# Patient Record
Sex: Female | Born: 1965 | Race: White | Hispanic: No | Smoking: Never smoker
Health system: Southern US, Community
[De-identification: ages and names within clinical notes are randomized; demographics above are authoritative.]

## PROBLEM LIST (undated history)

## (undated) DIAGNOSIS — R1011 Right upper quadrant pain: Secondary | ICD-10-CM

## (undated) DIAGNOSIS — K5792 Diverticulitis of intestine, part unspecified, without perforation or abscess without bleeding: Secondary | ICD-10-CM

## (undated) DIAGNOSIS — R011 Cardiac murmur, unspecified: Secondary | ICD-10-CM

## (undated) DIAGNOSIS — K219 Gastro-esophageal reflux disease without esophagitis: Secondary | ICD-10-CM

## (undated) DIAGNOSIS — E119 Type 2 diabetes mellitus without complications: Secondary | ICD-10-CM

## (undated) DIAGNOSIS — D219 Benign neoplasm of connective and other soft tissue, unspecified: Secondary | ICD-10-CM

## (undated) DIAGNOSIS — H269 Unspecified cataract: Secondary | ICD-10-CM

## (undated) DIAGNOSIS — F329 Major depressive disorder, single episode, unspecified: Secondary | ICD-10-CM

## (undated) DIAGNOSIS — N200 Calculus of kidney: Secondary | ICD-10-CM

## (undated) DIAGNOSIS — E039 Hypothyroidism, unspecified: Secondary | ICD-10-CM

## (undated) DIAGNOSIS — I10 Essential (primary) hypertension: Secondary | ICD-10-CM

## (undated) DIAGNOSIS — N83209 Unspecified ovarian cyst, unspecified side: Secondary | ICD-10-CM

## (undated) DIAGNOSIS — T7840XA Allergy, unspecified, initial encounter: Secondary | ICD-10-CM

## (undated) DIAGNOSIS — IMO0001 Reserved for inherently not codable concepts without codable children: Secondary | ICD-10-CM

## (undated) DIAGNOSIS — F32A Depression, unspecified: Secondary | ICD-10-CM

## (undated) HISTORY — PX: EXPLORATORY LAPAROTOMY: SUR591

## (undated) HISTORY — DX: Type 2 diabetes mellitus without complications: E11.9

## (undated) HISTORY — PX: COLONOSCOPY: SHX174

## (undated) HISTORY — DX: Allergy, unspecified, initial encounter: T78.40XA

## (undated) HISTORY — PX: LIPOSUCTION: SHX10

## (undated) HISTORY — DX: Gastro-esophageal reflux disease without esophagitis: K21.9

## (undated) HISTORY — PX: ENDOMETRIAL ABLATION: SHX621

## (undated) HISTORY — PX: TONSILLECTOMY: SUR1361

## (undated) HISTORY — DX: Cardiac murmur, unspecified: R01.1

## (undated) HISTORY — DX: Unspecified cataract: H26.9

## (undated) HISTORY — PX: DILATION AND CURETTAGE OF UTERUS: SHX78

## (undated) HISTORY — PX: TUBAL LIGATION: SHX77

---

## 1998-08-22 ENCOUNTER — Other Ambulatory Visit: Admission: RE | Admit: 1998-08-22 | Discharge: 1998-08-22 | Payer: Self-pay | Admitting: Obstetrics and Gynecology

## 2000-08-05 HISTORY — PX: BREAST EXCISIONAL BIOPSY: SUR124

## 2005-06-17 ENCOUNTER — Ambulatory Visit: Payer: Self-pay | Admitting: Gastroenterology

## 2005-10-18 ENCOUNTER — Ambulatory Visit (HOSPITAL_COMMUNITY): Admission: RE | Admit: 2005-10-18 | Discharge: 2005-10-18 | Payer: Self-pay | Admitting: Obstetrics and Gynecology

## 2005-10-18 ENCOUNTER — Encounter (INDEPENDENT_AMBULATORY_CARE_PROVIDER_SITE_OTHER): Payer: Self-pay | Admitting: *Deleted

## 2006-03-21 ENCOUNTER — Encounter: Admission: RE | Admit: 2006-03-21 | Discharge: 2006-03-21 | Payer: Self-pay | Admitting: Internal Medicine

## 2008-07-07 ENCOUNTER — Encounter: Admission: RE | Admit: 2008-07-07 | Discharge: 2008-07-07 | Payer: Self-pay | Admitting: Internal Medicine

## 2010-05-18 ENCOUNTER — Encounter: Admission: RE | Admit: 2010-05-18 | Discharge: 2010-05-18 | Payer: Self-pay | Admitting: Obstetrics and Gynecology

## 2010-08-25 ENCOUNTER — Encounter: Payer: Self-pay | Admitting: Obstetrics and Gynecology

## 2010-08-26 ENCOUNTER — Encounter: Payer: Self-pay | Admitting: Internal Medicine

## 2010-09-18 ENCOUNTER — Other Ambulatory Visit: Payer: Self-pay | Admitting: Internal Medicine

## 2010-09-18 DIAGNOSIS — R1011 Right upper quadrant pain: Secondary | ICD-10-CM

## 2010-09-20 ENCOUNTER — Ambulatory Visit
Admission: RE | Admit: 2010-09-20 | Discharge: 2010-09-20 | Disposition: A | Discharge status: Home / Self Care | Payer: BC Managed Care – PPO | Source: Ambulatory Visit | Attending: Internal Medicine | Admitting: Internal Medicine

## 2010-09-20 DIAGNOSIS — R1011 Right upper quadrant pain: Secondary | ICD-10-CM

## 2010-09-21 ENCOUNTER — Other Ambulatory Visit (HOSPITAL_COMMUNITY): Payer: Self-pay | Admitting: Internal Medicine

## 2010-09-21 DIAGNOSIS — R1011 Right upper quadrant pain: Secondary | ICD-10-CM

## 2010-10-03 ENCOUNTER — Other Ambulatory Visit (HOSPITAL_COMMUNITY): Payer: BC Managed Care – PPO

## 2010-10-03 ENCOUNTER — Encounter (HOSPITAL_COMMUNITY): Payer: Self-pay

## 2010-10-03 ENCOUNTER — Encounter (HOSPITAL_COMMUNITY)
Admission: RE | Admit: 2010-10-03 | Discharge: 2010-10-03 | Disposition: A | Discharge status: Home / Self Care | Payer: BC Managed Care – PPO | Source: Ambulatory Visit | Attending: Internal Medicine | Admitting: Internal Medicine

## 2010-10-03 DIAGNOSIS — R1011 Right upper quadrant pain: Secondary | ICD-10-CM

## 2010-10-03 DIAGNOSIS — R112 Nausea with vomiting, unspecified: Secondary | ICD-10-CM | POA: Insufficient documentation

## 2010-10-03 HISTORY — DX: Right upper quadrant pain: R10.11

## 2010-10-03 MED ORDER — SINCALIDE 5 MCG IJ SOLR: 0.0200 ug/kg | Freq: Once | INTRAMUSCULAR | Status: DC

## 2010-10-03 MED ORDER — TECHNETIUM TC 99M MEBROFENIN IV KIT
5.3000 | PACK | Freq: Once | INTRAVENOUS | Status: AC | PRN
  Administered 2010-10-03: 5.3 via INTRAVENOUS

## 2010-12-21 NOTE — Op Note (Signed)
Kristen Conner, Kristen Conner         ACCOUNT NO.:  [id number]   MEDICAL RECORD NO.:  [id number]          PATIENT TYPE:  AMB   LOCATION:  SDC                           FACILITY:  [facility]   PHYSICIAN:  Juluis Mire, M.D.   DATE OF BIRTH:  [birth year]   DATE OF PROCEDURE:  10/18/2005  DATE OF DISCHARGE:                                 OPERATIVE REPORT   PREOPERATIVE DIAGNOSES:  1.  Multiparity.  Desires sterility.  2.  Menorrhagia.   OPERATION/PROCEDURE:  1.  Hysteroscopy.  2.  ThermaChoice ablation.  3.  Laparoscopy with bilateral tubal fulguration.   SURGEON:  Juluis Mire, M.D.   ANESTHESIA:  General.   ESTIMATED BLOOD LOSS:  Minimal.   PACKS AND DRAINS:  None.   INTRAOPERATIVE BLOOD REPLACED:  None.   COMPLICATIONS:  None.   INDICATIONS:  Dictated in history and physical.   DESCRIPTION OF PROCEDURE:  The patient was taken to the OR and placed in the  supine position.  After satisfactory level of general anesthesia obtained,  the patient was placed in the dorsal lithotomy position using the Allen  stirrups.  The abdomen, peritoneum, and vagina were prepped out with  Betadine.  The patient was draped out for hysteroscopy.  Speculum was placed  in the vaginal vault.  Cervix was grasped with the single-tooth tenaculum.  Uterus sounded approximately 8 cm.  Cervix was dilated to a size 16 Pratt  dilator.  The hysteroscope was introduced.  Intrauterine cavity was  distended using lactated Ringer's.  Visualization revealed some shaggy  endometrium. We could see the left tubal ostium.  The right tubal ostium was  difficult to visualize.  We could place the Essure.  At this point in time  the decision was to proceed with ThermaChoice ablation.  The balloon was  made ready.  This was inserted into the intrauterine cavity.  The balloon  was distended until we got a consistent pressure reading of approximately  180.  The ThermaChoice cycle was undertaken for eight minutes.  After  the  cool-down period, the ThermaChoice balloon was deflated and removed intact.  We rehysteroscoped to see if we could see the tubal ostium.  Again, we could  see the left side but not the right.  The decision was to proceed with  laparoscopic tubal which we had discussed with the patient.  At this point  in time the patient was reprepped and draped.  The bladder had been emptied  by out catheterization and a Hulka tenaculum put in place.  Subumbilical  incision made with the knife and carried through the subcutaneous tissue and  fascia.  Fascia extended laterally.  Rectus muscles were separated.  We were  able to enter the peritoneum with blunt pressure.  Taut open laparoscopic  trocar was inserted and laparoscope was then brought into place.  Visualization revealed no evidence of injury to adjacent organs.  Uterus,  tubes and ovaries were unremarkable.  Mid segment of each tube was  identified.  Using the bipolar, we coagulate approximately a 3 cm segment of  each tube.  Coagulation was continued until resistance read 0.  Each tube  was then recoagulated, completely desiccating the tube.  Coagulation did  extend out to the mesosalpinx.  At this point in time both tubes were  adequately coagulated.  Uterus, tubes and ovaries were unremarkable.  Appendix was visualized and noted to be normal.  The abdomen was deflated  with carbon dioxide.  Trocar and Taut device were removed.  The fascia was  closed with two figure-of-eights of 0 Vicryl, skin with interrupted  subcuticular of 4-0 Vicryl.  The Hulka tenaculum was then removed.  The  patient was taken out of the dorsal lithotomy position and once alert and  extubated, was transferred to the recovery room in good condition.      Juluis Mire, M.D.  Electronically Signed     JSM/MEDQ  D:  10/18/2005  T:  10/19/2005  Job:  [id number]

## 2010-12-21 NOTE — H&P (Signed)
NAME:  Kristen Conner, Kristen Conner         ACCOUNT NO.:  [id number]   MEDICAL RECORD NO.:  [id number]          PATIENT TYPE:  AMB   LOCATION:  SDC                           FACILITY:  [facility]   PHYSICIAN:  Juluis Mire, M.D.   DATE OF BIRTH:  02-01-1966   DATE OF ADMISSION:  10/18/2005  DATE OF DISCHARGE:                                HISTORY & PHYSICAL   The patient is a 45 year old gravida 4, para 2, abortus 2, female, who  presents for Essuretubal ligation, as well as ThermaChoice endometrial  ablation.   In relation to the present admission, the patient has been having trouble  with increasing menorrhagia.  The cycles are every 21-24 days.  She has  extremely heavy flow with clots.  She underwent a saline infusion ultrasound  and she had 1 small fundal fibroid, but otherwiseunremarkable and felt that  she had adenomyosis.  Options were discussed including the use of birth  control pills versus Mirena IUD versus ablation versus hysterectomy.  The  patient has chosen ablation.  She does need to do something for permanent  sterilization.  We will go with the Essure placement for tubal.  She does  understand the irreversibility of sterilization.  The failure rate of 1/200  is quoted and the failures can be in the form of ectopic pregnancy requiring  further surgical management.   ALLERGIES:  THE PATIENT HAS NO KNOWN DRUG ALLERGIES.   MEDICATIONS:  None.   PAST MEDICAL HISTORY:  1.  Usual childhood diseases.  2.  No significant sequelae.   PAST SURGICAL HISTORY:  In 1994, she had laparoscopy with removal of a  pedunculated fibroid and treatment of endometriosis.  Then in 1996, she had  repeat laparoscopy with lysis of adhesions.   OBSTETRICAL HISTORY:  She has had 2 vaginal deliveries and 2 miscarriages.   SOCIAL HISTORY:  There has been no tobacco or alcohol use.   REVIEW OF SYSTEMS:  Noncontributory.   FAMILY HISTORY:  Noncontributory.   PHYSICAL EXAMINATION:  VITAL SIGNS:  The  patient is afebrile with stable  vital signs.  HEENT EXAM:  The patient is normocephalic.  Pupils are equal, round and  reactive to light and accommodation.  Extraocular movements were intact.  Sclerae and conjunctivae clear.  Oropharynx clear.  NECK:  Without thyromegaly.  BREASTS:  Not examined.  LUNGS:  Clear.  CARDIAC SYSTEM:  Regular rhythm and rate without murmurs or gallops.  ABDOMINAL EXAM:  Benign.  No masses, organomegaly or tenderness.  PELVIC:  Normal external genitalia.  Vaginal mucosa clear.  Cervix  unremarkable.  Uterus normal size, shape and contour.  Adnexa free of masses  or tenderness.  EXTREMITIES:  Trace edema.  NEUROLOGIC EXAM:  Grossly within normal limits.   IMPRESSION:  1.  Menorrhagia secondary to adenomyosis.  2.  Multiparity and desires sterility.   PLAN:  The patient is first to undergo Essure tubal.  We have discussed the  risk of this, which could include the risk of perforation.  The risk of  misplacement of the spring that could require laparoscopic removal.  She  does understand the need  for using something for 3 months and then  hysterosalpingogram to document tubal blockage.  If this comes back  negative, then further options need to be considered for sterilization.  In  terms of the ablation technique, success rates of approximately 40-70% are  quoted.  The risk of the procedure given and explained including the risk of  infection.  The risk of hemorrhage could require a transfusion or possible  hysterectomy, risk of injury to adjacent organs particularly bowel that  could require exploratory surgery, risk of deep venous thrombosis and  pulmonary emboli.  The patient expressed understanding of the indications  and risks.      Juluis Mire, M.D.  Electronically Signed     JSM/MEDQ  D:  10/18/2005  T:  10/19/2005  Job:  [id number]

## 2011-12-12 ENCOUNTER — Other Ambulatory Visit: Payer: Self-pay | Admitting: Obstetrics and Gynecology

## 2011-12-12 DIAGNOSIS — R234 Changes in skin texture: Secondary | ICD-10-CM

## 2011-12-17 ENCOUNTER — Ambulatory Visit
Admission: RE | Admit: 2011-12-17 | Discharge: 2011-12-17 | Disposition: A | Discharge status: Home / Self Care | Payer: BC Managed Care – PPO | Source: Ambulatory Visit | Attending: Obstetrics and Gynecology | Admitting: Obstetrics and Gynecology

## 2011-12-17 ENCOUNTER — Other Ambulatory Visit: Payer: BC Managed Care – PPO

## 2011-12-17 DIAGNOSIS — R234 Changes in skin texture: Secondary | ICD-10-CM

## 2011-12-30 ENCOUNTER — Emergency Department (HOSPITAL_BASED_OUTPATIENT_CLINIC_OR_DEPARTMENT_OTHER): Payer: BC Managed Care – PPO

## 2011-12-30 ENCOUNTER — Emergency Department (HOSPITAL_BASED_OUTPATIENT_CLINIC_OR_DEPARTMENT_OTHER)
Admission: EM | Admit: 2011-12-30 | Discharge: 2011-12-30 | Disposition: A | Discharge status: Home / Self Care | Payer: BC Managed Care – PPO | Attending: Emergency Medicine | Admitting: Emergency Medicine

## 2011-12-30 ENCOUNTER — Encounter (HOSPITAL_BASED_OUTPATIENT_CLINIC_OR_DEPARTMENT_OTHER): Payer: Self-pay | Admitting: *Deleted

## 2011-12-30 DIAGNOSIS — F329 Major depressive disorder, single episode, unspecified: Secondary | ICD-10-CM | POA: Insufficient documentation

## 2011-12-30 DIAGNOSIS — K219 Gastro-esophageal reflux disease without esophagitis: Secondary | ICD-10-CM | POA: Insufficient documentation

## 2011-12-30 DIAGNOSIS — E039 Hypothyroidism, unspecified: Secondary | ICD-10-CM | POA: Insufficient documentation

## 2011-12-30 DIAGNOSIS — R197 Diarrhea, unspecified: Secondary | ICD-10-CM | POA: Insufficient documentation

## 2011-12-30 DIAGNOSIS — R1031 Right lower quadrant pain: Secondary | ICD-10-CM | POA: Insufficient documentation

## 2011-12-30 DIAGNOSIS — R110 Nausea: Secondary | ICD-10-CM | POA: Insufficient documentation

## 2011-12-30 DIAGNOSIS — Z87442 Personal history of urinary calculi: Secondary | ICD-10-CM | POA: Insufficient documentation

## 2011-12-30 DIAGNOSIS — R6883 Chills (without fever): Secondary | ICD-10-CM | POA: Insufficient documentation

## 2011-12-30 DIAGNOSIS — F3289 Other specified depressive episodes: Secondary | ICD-10-CM | POA: Insufficient documentation

## 2011-12-30 DIAGNOSIS — Z79899 Other long term (current) drug therapy: Secondary | ICD-10-CM | POA: Insufficient documentation

## 2011-12-30 DIAGNOSIS — R109 Unspecified abdominal pain: Secondary | ICD-10-CM

## 2011-12-30 DIAGNOSIS — I10 Essential (primary) hypertension: Secondary | ICD-10-CM | POA: Insufficient documentation

## 2011-12-30 HISTORY — DX: Diverticulitis of intestine, part unspecified, without perforation or abscess without bleeding: K57.92

## 2011-12-30 HISTORY — DX: Benign neoplasm of connective and other soft tissue, unspecified: D21.9

## 2011-12-30 HISTORY — DX: Calculus of kidney: N20.0

## 2011-12-30 HISTORY — DX: Reserved for inherently not codable concepts without codable children: IMO0001

## 2011-12-30 HISTORY — DX: Gastro-esophageal reflux disease without esophagitis: K21.9

## 2011-12-30 HISTORY — DX: Unspecified ovarian cyst, unspecified side: N83.209

## 2011-12-30 HISTORY — DX: Major depressive disorder, single episode, unspecified: F32.9

## 2011-12-30 HISTORY — DX: Depression, unspecified: F32.A

## 2011-12-30 HISTORY — DX: Essential (primary) hypertension: I10

## 2011-12-30 HISTORY — DX: Hypothyroidism, unspecified: E03.9

## 2011-12-30 LAB — CBC
HCT: 42.7 % (ref 36.0–46.0)
Hemoglobin: 15.1 g/dL — ABNORMAL HIGH (ref 12.0–15.0)
MCH: 30.8 pg (ref 26.0–34.0)
MCHC: 35.4 g/dL (ref 30.0–36.0)
MCV: 87.1 fL (ref 78.0–100.0)
Platelets: 279 10*3/uL (ref 150–400)
RBC: 4.9 MIL/uL (ref 3.87–5.11)
RDW: 12.5 % (ref 11.5–15.5)
WBC: 8.5 10*3/uL (ref 4.0–10.5)

## 2011-12-30 LAB — COMPREHENSIVE METABOLIC PANEL WITH GFR
Albumin: 4 g/dL (ref 3.5–5.2)
BUN: 9 mg/dL (ref 6–23)
Chloride: 105 meq/L (ref 96–112)
Creatinine, Ser: 0.6 mg/dL (ref 0.50–1.10)
GFR calc Af Amer: >90 mL/min (ref >90)
GFR calc non Af Amer: >90 mL/min (ref >90)
Glucose, Bld: 122 mg/dL — ABNORMAL HIGH (ref 70–99)
Total Bilirubin: 0.5 mg/dL (ref 0.3–1.2)

## 2011-12-30 LAB — URINALYSIS, ROUTINE W REFLEX MICROSCOPIC
Bilirubin Urine: NEGATIVE
Glucose, UA: NEGATIVE mg/dL
Hgb urine dipstick: NEGATIVE
Ketones, ur: NEGATIVE mg/dL
Leukocytes, UA: NEGATIVE
Nitrite: NEGATIVE
Protein, ur: NEGATIVE mg/dL
Specific Gravity, Urine: 1.022 (ref 1.005–1.030)
Urobilinogen, UA: 0.2 mg/dL (ref 0.0–1.0)
pH: 5.5 (ref 5.0–8.0)

## 2011-12-30 LAB — COMPREHENSIVE METABOLIC PANEL
ALT: 31 U/L (ref 0–35)
AST: 21 U/L (ref 0–37)
Alkaline Phosphatase: 83 U/L (ref 39–117)
CO2: 27 mEq/L (ref 19–32)
Calcium: 9.5 mg/dL (ref 8.4–10.5)
Potassium: 3.4 mEq/L — ABNORMAL LOW (ref 3.5–5.1)
Sodium: 141 mEq/L (ref 135–145)
Total Protein: 7.2 g/dL (ref 6.0–8.3)

## 2011-12-30 LAB — DIFFERENTIAL
Basophils Absolute: 0.1 10*3/uL (ref 0.0–0.1)
Basophils Relative: 1 % (ref 0–1)
Eosinophils Absolute: 0.1 10*3/uL (ref 0.0–0.7)
Eosinophils Relative: 1 % (ref 0–5)
Lymphocytes Relative: 24 % (ref 12–46)
Lymphs Abs: 2.1 10*3/uL (ref 0.7–4.0)
Monocytes Absolute: 0.4 K/uL (ref 0.1–1.0)
Monocytes Relative: 4 % (ref 3–12)
Neutro Abs: 5.9 K/uL (ref 1.7–7.7)
Neutrophils Relative %: 70 % (ref 43–77)

## 2011-12-30 LAB — PREGNANCY, URINE: Preg Test, Ur: NEGATIVE

## 2011-12-30 LAB — LIPASE, BLOOD: Lipase: 26 U/L (ref 11–59)

## 2011-12-30 MED ORDER — HYDROCODONE-ACETAMINOPHEN 5-325 MG PO TABS: ORAL_TABLET | ORAL | Status: AC

## 2011-12-30 MED ORDER — IOHEXOL 300 MG/ML  SOLN
100.0000 mL | Freq: Once | INTRAMUSCULAR | Status: AC | PRN
  Administered 2011-12-30: 100 mL via INTRAVENOUS

## 2011-12-30 MED ORDER — AMOXICILLIN-POT CLAVULANATE 875-125 MG PO TABS: 1.0000 | ORAL_TABLET | Freq: Two times a day (BID) | ORAL | Status: AC

## 2011-12-30 MED ORDER — KETOROLAC TROMETHAMINE 30 MG/ML IJ SOLN
30.0000 mg | Freq: Once | INTRAMUSCULAR | Status: AC
  Administered 2011-12-30: 30 mg via INTRAVENOUS
  Filled 2011-12-30: qty 1

## 2011-12-30 MED ORDER — IOHEXOL 300 MG/ML  SOLN
36.0000 mL | Freq: Once | INTRAMUSCULAR | Status: AC | PRN
  Administered 2011-12-30: 36 mL via ORAL

## 2011-12-30 MED ORDER — SODIUM CHLORIDE 0.9 % IV SOLN
Freq: Once | INTRAVENOUS | Status: AC
  Administered 2011-12-30: 12:00:00 via INTRAVENOUS

## 2011-12-30 NOTE — ED Provider Notes (Signed)
History     CSN: [id number]  Arrival date & time 12/30/11  1008   First MD Initiated Contact with Patient 12/30/11 1108      Chief Complaint  Patient presents with  . Abdominal Pain    RLQ    (Consider location/radiation/quality/duration/timing/severity/associated sxs/prior treatment) HPI Comments: Pt reports h/o kidney stone, diverticulitis on right side and ovarian cyst, reports gradual mild pain to RLQ on Friday, has gotten worse and has waxing and waning quality.  Currently pain is a 1/10, yesterday had gotten up to a 8/10 pain while taking a shower, had to lay down due to severity, hard to move.  No dysuria, hematuria.  Slight nausea and slight chill, but no known fever, no vomiting.  Had a loose stool for a few days previously as well but she has some IBS and didn't think the loose stool a few days ago was unusual.  No vaginal bleeding.  No back pain, denies trauma.  No skin rash.    Patient is a 46 y.o. female presenting with abdominal pain. The history is provided by the patient.  Abdominal Pain The primary symptoms of the illness include abdominal pain, nausea and diarrhea. The primary symptoms of the illness do not include fever, vomiting, dysuria, vaginal discharge or vaginal bleeding.  Additional symptoms associated with the illness include chills. Symptoms associated with the illness do not include constipation, hematuria or back pain.    Past Medical History  Diagnosis Date  . RUQ abdominal pain   . Diverticulitis   . Hypothyroid   . Kidney stone   . Hypertension   . Reflux   . Depression   . Fibroid tumor   . Ovarian cyst     Past Surgical History  Procedure Date  . Tonsillectomy   . Exploratory laparotomy   . Dilation and curettage of uterus   . Liposuction   . Breast biopsy   . Endometrial ablation   . Tubal ligation     History reviewed. No pertinent family history.  History  Substance Use Topics  . Smoking status: Never Smoker   . Smokeless  tobacco: Not on file  . Alcohol Use: Yes     rarely    OB History    Grav Para Term Preterm Abortions TAB SAB Ect Mult Living                  Review of Systems  Constitutional: Positive for chills and appetite change. Negative for fever.  Gastrointestinal: Positive for nausea, abdominal pain and diarrhea. Negative for vomiting, constipation and blood in stool.  Genitourinary: Negative for dysuria, hematuria, flank pain, vaginal bleeding and vaginal discharge.  Musculoskeletal: Negative for back pain.  Skin: Negative for rash and wound.  All other systems reviewed and are negative.    Allergies  Demerol  Home Medications   Current Outpatient Rx  Name Route Sig Dispense Refill  . AMLODIPINE BESYLATE 5 MG PO TABS Oral Take 5 mg by mouth daily.    Marland Kitchen CITALOPRAM HYDROBROMIDE 40 MG PO TABS Oral Take 40 mg by mouth daily.    . ERGOCALCIFEROL 50000 UNITS PO CAPS Oral Take 50,000 Units by mouth once a week.    Marland Kitchen LEVOTHYROXINE SODIUM 100 MCG PO TABS Oral Take 100 mcg by mouth daily.    . AMOXICILLIN-POT CLAVULANATE 875-125 MG PO TABS Oral Take 1 tablet by mouth 2 (two) times daily. 20 tablet 0  . HYDROCODONE-ACETAMINOPHEN 5-325 MG PO TABS  1-2 tablets po q  6 hours prn moderate to severe pain 20 tablet 0    BP 136/88  Pulse 87  Temp(Src) 98.3 F (36.8 C) (Oral)  Resp 20  Ht 5\' 7"  (1.702 m)  Wt 205 lb (92.987 kg)  BMI 32.11 kg/m2  SpO2 99%  Physical Exam  Nursing note and vitals reviewed. Constitutional: She appears well-developed and well-nourished.  HENT:  Head: Normocephalic and atraumatic.  Mouth/Throat: Oropharynx is clear and moist.  Eyes: No scleral icterus.  Neck: Normal range of motion. Neck supple.  Cardiovascular: Normal rate.   Pulmonary/Chest: Effort normal and breath sounds normal. No respiratory distress.  Abdominal: Soft. She exhibits no distension. There is no tenderness. There is no rebound and no guarding.  Neurological: She is alert.  Skin: Skin is  warm. No rash noted. No pallor.    ED Course  Procedures (including critical care time)  Labs Reviewed  URINALYSIS, ROUTINE W REFLEX MICROSCOPIC - Abnormal; Notable for the following:    APPearance CLOUDY (*)    All other components within normal limits  CBC - Abnormal; Notable for the following:    Hemoglobin 15.1 (*)    All other components within normal limits  COMPREHENSIVE METABOLIC PANEL - Abnormal; Notable for the following:    Potassium 3.4 (*)    Glucose, Bld 122 (*)    All other components within normal limits  PREGNANCY, URINE  DIFFERENTIAL  LIPASE, BLOOD   Ct Abdomen Pelvis W Contrast  12/30/2011  *RADIOLOGY REPORT*  Clinical Data: Right-sided abdominal pain, nausea  CT ABDOMEN AND PELVIS WITH CONTRAST  Technique:  Multidetector CT imaging of the abdomen and pelvis was performed following the standard protocol during bolus administration of intravenous contrast.  Contrast: 36mL OMNIPAQUE IOHEXOL 300 MG/ML  SOLN, OMNIPAQUE IOHEXOL 300 MG/ML  SOLN  Comparison: None.  Findings: Lung bases are clear.  Small hiatal hernia.  Mild hepatic steatosis.  Spleen, pancreas, and adrenal glands are within normal limits.  Gallbladder is unremarkable.  No intrahepatic or extrahepatic ductal dilatation.  Kidneys are within normal limits.  No hydronephrosis.  No evidence of bowel obstruction.  Normal appendix.  Extensive colonic diverticulosis.  Minimal stranding in the left paracolic gutter (series 2/image 47), nonspecific, without sufficient inflammatory changes to suggest acute diverticulitis.  No evidence of abdominal aortic aneurysm.  No abdominopelvic ascites.  No suspicious abdominopelvic lymphadenopathy.  Uterus is mildly heterogeneous.  Bilateral ovaries are unremarkable, noting a left corpus luteal cyst.  Bladder is within normal limits.  Degenerative changes with scoliosis of the visualized thoracolumbar spine.  IMPRESSION: Normal appendix.  No evidence of bowel obstruction.   Extensive colonic diverticulosis, without convincing inflammatory changes.  Mild hepatic steatosis.  Original Report Authenticated By: Charline Bills, M.D.   I reviewed the above CT scan and reviewed results per radiologist.    1. Abdominal pain     1:53 PM Pt's pain continues to be minimal, abd is soft, no guard or rebound.  Pt feels comfortable going home, follow up with own PCP and defer pelvic exam for now with understanding that should pelvic pain or vomiting or fever ensure, to be rechecked.  Will put on augmentin due to extensive diverticulosis seen on CT scan and clinically, diverticulitis is what I was most suspicious for.    MDM  Pt's pain was not sudden onset, I would favor early diverticulitis as cause of gradual onset and now some waxing and waning pain, versus non specific abd pain.  No UTI, no hematuria on UA.  No vomiting or colicky pain to suggest torsion, stone, or ruptured cyst.          Gavin Pound. Bronislaw Switzer, MD 12/30/11 1353

## 2011-12-30 NOTE — ED Notes (Signed)
Patient states she developed right lower back, flank and rlq abominal pain 4 days ago.  Pain worse over the weekend.  Hx of kidney stones, ovarian cyst and diverticulitis on right. Symptoms associated with nausea.

## 2015-06-15 ENCOUNTER — Other Ambulatory Visit: Payer: Self-pay | Admitting: Internal Medicine

## 2015-06-15 DIAGNOSIS — R1011 Right upper quadrant pain: Secondary | ICD-10-CM

## 2015-06-20 ENCOUNTER — Other Ambulatory Visit

## 2015-06-27 ENCOUNTER — Other Ambulatory Visit

## 2015-08-01 ENCOUNTER — Other Ambulatory Visit: Payer: Self-pay | Admitting: Internal Medicine

## 2015-08-01 ENCOUNTER — Ambulatory Visit
Admission: RE | Admit: 2015-08-01 | Discharge: 2015-08-01 | Disposition: A | Discharge status: Home / Self Care | Payer: BLUE CROSS/BLUE SHIELD | Source: Ambulatory Visit | Attending: Internal Medicine | Admitting: Internal Medicine

## 2015-08-01 DIAGNOSIS — R1011 Right upper quadrant pain: Secondary | ICD-10-CM

## 2016-08-06 ENCOUNTER — Other Ambulatory Visit: Payer: Self-pay | Admitting: Obstetrics and Gynecology

## 2016-08-06 DIAGNOSIS — R928 Other abnormal and inconclusive findings on diagnostic imaging of breast: Secondary | ICD-10-CM

## 2016-08-12 ENCOUNTER — Ambulatory Visit
Admission: RE | Admit: 2016-08-12 | Discharge: 2016-08-12 | Disposition: A | Discharge status: Home / Self Care | Source: Ambulatory Visit | Attending: Obstetrics and Gynecology | Admitting: Obstetrics and Gynecology

## 2016-08-12 ENCOUNTER — Other Ambulatory Visit: Payer: BLUE CROSS/BLUE SHIELD

## 2016-08-12 DIAGNOSIS — R928 Other abnormal and inconclusive findings on diagnostic imaging of breast: Secondary | ICD-10-CM

## 2017-03-11 ENCOUNTER — Encounter: Payer: Self-pay | Admitting: Internal Medicine

## 2017-04-29 ENCOUNTER — Other Ambulatory Visit: Payer: Self-pay | Admitting: Obstetrics and Gynecology

## 2017-04-29 ENCOUNTER — Other Ambulatory Visit: Payer: Self-pay | Admitting: Neurosurgery

## 2017-04-29 DIAGNOSIS — Z1231 Encounter for screening mammogram for malignant neoplasm of breast: Secondary | ICD-10-CM

## 2017-05-16 ENCOUNTER — Encounter

## 2017-05-20 ENCOUNTER — Ambulatory Visit (AMBULATORY_SURGERY_CENTER): Payer: Self-pay | Admitting: *Deleted

## 2017-05-20 VITALS — Ht 65.0 in | Wt 203.8 lb

## 2017-05-20 DIAGNOSIS — Z1211 Encounter for screening for malignant neoplasm of colon: Secondary | ICD-10-CM

## 2017-05-20 MED ORDER — NA SULFATE-K SULFATE-MG SULF 17.5-3.13-1.6 GM/177ML PO SOLN: 1.0000 [IU] | Freq: Once | ORAL | 0 refills | Status: AC

## 2017-05-20 NOTE — Progress Notes (Signed)
No egg or soy allergy known to patient  No issues with past sedation with any surgeries  or procedures, no intubation problems   N&V with some No diet pills per patient No home 02 use per patient  No blood thinners per patient  Pt denies issues with constipation  No A fib or A flutter  EMMI video sent to pt's e mail

## 2017-05-30 ENCOUNTER — Encounter: Payer: Self-pay | Admitting: Internal Medicine

## 2017-05-30 ENCOUNTER — Ambulatory Visit (AMBULATORY_SURGERY_CENTER): Admitting: Internal Medicine

## 2017-05-30 VITALS — BP 117/68 | HR 81 | Temp 97.8°F | Resp 12 | Ht 65.0 in | Wt 203.0 lb

## 2017-05-30 DIAGNOSIS — K635 Polyp of colon: Secondary | ICD-10-CM | POA: Diagnosis not present

## 2017-05-30 DIAGNOSIS — Z1212 Encounter for screening for malignant neoplasm of rectum: Secondary | ICD-10-CM | POA: Diagnosis not present

## 2017-05-30 DIAGNOSIS — D125 Benign neoplasm of sigmoid colon: Secondary | ICD-10-CM | POA: Diagnosis not present

## 2017-05-30 DIAGNOSIS — Z1211 Encounter for screening for malignant neoplasm of colon: Secondary | ICD-10-CM | POA: Diagnosis present

## 2017-05-30 MED ORDER — SODIUM CHLORIDE 0.9 % IV SOLN: 500.0000 mL | INTRAVENOUS | Status: DC

## 2017-05-30 NOTE — Op Note (Signed)
[facility] Patient Name: Kristen Conner Procedure Date: 05/30/2017 8:07 AM MRN: [id number] Endoscopist: Jerene Bears , MD Age: 51 Referring MD:  Date of Birth: Jan 23, 1966 Gender: Female Account #: [id number] Procedure:                Colonoscopy Indications:              Screening for colorectal malignant neoplasm Medicines:                Monitored Anesthesia Care Procedure:                Pre-Anesthesia Assessment:                           - Prior to the procedure, a History and Physical                            was performed, and patient medications and                            allergies were reviewed. The patient's tolerance of                            previous anesthesia was also reviewed. The risks                            and benefits of the procedure and the sedation                            options and risks were discussed with the patient.                            All questions were answered, and informed consent                            was obtained. Prior Anticoagulants: The patient has                            taken no previous anticoagulant or antiplatelet                            agents. ASA Grade Assessment: II - A patient with                            mild systemic disease. After reviewing the risks                            and benefits, the patient was deemed in                            satisfactory condition to undergo the procedure.                           After obtaining informed consent, the colonoscope  was passed under direct vision. Throughout the                            procedure, the patient's blood pressure, pulse, and                            oxygen saturations were monitored continuously. The                            Colonoscope was introduced through the anus and                            advanced to the the cecum, identified by                            appendiceal orifice and  ileocecal valve. The                            colonoscopy was performed without difficulty. The                            patient tolerated the procedure well. The quality                            of the bowel preparation was good. The ileocecal                            valve, appendiceal orifice, and rectum were                            photographed. Scope In: 8:08:53 AM Scope Out: 8:30:21 AM Scope Withdrawal Time: 0 hours 17 minutes 9 seconds  Total Procedure Duration: 0 hours 21 minutes 28 seconds  Findings:                 The digital rectal exam was normal.                           A 10 mm polyp was found in the sigmoid colon. The                            polyp was sessile. The polyp was removed with a                            cold snare. Resection and retrieval were complete.                           A 6 mm polyp was found in the distal sigmoid colon.                            The polyp was sessile. The polyp was removed with a                            cold snare.  Resection and retrieval were complete.                           Multiple small and large-mouthed diverticula were                            found in the sigmoid colon, descending colon,                            hepatic flexure and ascending colon.                           Internal hemorrhoids were found during                            retroflexion. The hemorrhoids were small. Complications:            No immediate complications. Estimated Blood Loss:     Estimated blood loss was minimal. Impression:               - One 10 mm polyp in the sigmoid colon, removed                            with a cold snare. Resected and retrieved.                           - One 6 mm polyp in the distal sigmoid colon,                            removed with a cold snare. Resected and retrieved.                           - Moderate diverticulosis in the sigmoid colon, in                            the descending  colon, at the hepatic flexure and in                            the ascending colon.                           - Small internal hemorrhoids. Recommendation:           - Patient has a contact number available for                            emergencies. The signs and symptoms of potential                            delayed complications were discussed with the                            patient. Return to normal activities tomorrow.  Written discharge instructions were provided to the                            patient.                           - Resume previous diet.                           - Continue present medications.                           - Await pathology results.                           - Repeat colonoscopy is recommended. The                            colonoscopy date will be determined after pathology                            results from today's exam become available for                            review. Jerene Bears, MD 05/30/2017 8:37:11 AM This report has been signed electronically.

## 2017-05-30 NOTE — Progress Notes (Signed)
No problems noted in the recovery room. maw 

## 2017-05-30 NOTE — Progress Notes (Signed)
Report to PACU, RN, vss, BBS= Clear.  

## 2017-05-30 NOTE — Progress Notes (Signed)
Called to room to assist during endoscopic procedure.  Patient ID and intended procedure confirmed with present staff. Received instructions for my participation in the procedure from the performing physician.  

## 2017-05-30 NOTE — Progress Notes (Signed)
Pt's states no medical or surgical changes since previsit or office visit. 

## 2017-05-30 NOTE — Patient Instructions (Signed)
YOU HAD AN ENDOSCOPIC PROCEDURE TODAY AT [facility]:   Refer to the procedure report that was given to you for any specific questions about what was found during the examination.  If the procedure report does not answer your questions, please call your gastroenterologist to clarify.  If you requested that your care partner not be given the details of your procedure findings, then the procedure report has been included in a sealed envelope for you to review at your convenience later.  YOU SHOULD EXPECT: Some feelings of bloating in the abdomen. Passage of more gas than usual.  Walking can help get rid of the air that was put into your GI tract during the procedure and reduce the bloating. If you had a lower endoscopy (such as a colonoscopy or flexible sigmoidoscopy) you may notice spotting of blood in your stool or on the toilet paper. If you underwent a bowel prep for your procedure, you may not have a normal bowel movement for a few days.  Please Note:  You might notice some irritation and congestion in your nose or some drainage.  This is from the oxygen used during your procedure.  There is no need for concern and it should clear up in a day or so.  SYMPTOMS TO REPORT IMMEDIATELY:   Following lower endoscopy (colonoscopy or flexible sigmoidoscopy):  Excessive amounts of blood in the stool  Significant tenderness or worsening of abdominal pains  Swelling of the abdomen that is new, acute  Fever of 100F or higher   For urgent or emergent issues, a gastroenterologist can be reached at any hour by calling [phone].   DIET:  We do recommend a small meal at first, but then you may proceed to your regular diet.  Drink plenty of fluids but you should avoid alcoholic beverages for 24 hours.  ACTIVITY:  You should plan to take it easy for the rest of today and you should NOT DRIVE or use heavy machinery until tomorrow (because of the sedation medicines used during the test).     FOLLOW UP: Our staff will call the number listed on your records the next business day following your procedure to check on you and address any questions or concerns that you may have regarding the information given to you following your procedure. If we do not reach you, we will leave a message.  However, if you are feeling well and you are not experiencing any problems, there is no need to return our call.  We will assume that you have returned to your regular daily activities without incident.  If any biopsies were taken you will be contacted by phone or by letter within the next 1-3 weeks.  Please call us at [phone] if you have not heard about the biopsies in 3 weeks.    SIGNATURES/CONFIDENTIALITY: You and/or your care partner have signed paperwork which will be entered into your electronic medical record.  These signatures attest to the fact that that the information above on your After Visit Summary has been reviewed and is understood.  Full responsibility of the confidentiality of this discharge information lies with you and/or your care-partner.    Handouts were given to your care partner on polyps, diverticulosis, and hemorrhoids. Your blood sugar was 110 in the recovery room. You may resume your current medications today. Await biopsy results. Please call if any questions or concerns.

## 2017-06-05 ENCOUNTER — Encounter: Payer: Self-pay | Admitting: Internal Medicine

## 2017-08-04 ENCOUNTER — Ambulatory Visit
Admission: RE | Admit: 2017-08-04 | Discharge: 2017-08-04 | Disposition: A | Discharge status: Home / Self Care | Source: Ambulatory Visit | Attending: Obstetrics and Gynecology | Admitting: Obstetrics and Gynecology

## 2017-08-04 DIAGNOSIS — Z1231 Encounter for screening mammogram for malignant neoplasm of breast: Secondary | ICD-10-CM

## 2018-12-08 ENCOUNTER — Other Ambulatory Visit: Payer: Self-pay

## 2018-12-08 ENCOUNTER — Ambulatory Visit
Admission: RE | Admit: 2018-12-08 | Discharge: 2018-12-08 | Disposition: A | Discharge status: Home / Self Care | Source: Ambulatory Visit | Attending: Internal Medicine | Admitting: Internal Medicine

## 2018-12-08 ENCOUNTER — Other Ambulatory Visit: Payer: Self-pay | Admitting: Internal Medicine

## 2018-12-08 DIAGNOSIS — N644 Mastodynia: Secondary | ICD-10-CM

## 2019-09-13 ENCOUNTER — Other Ambulatory Visit: Payer: Self-pay | Admitting: Obstetrics and Gynecology

## 2019-09-13 DIAGNOSIS — R928 Other abnormal and inconclusive findings on diagnostic imaging of breast: Secondary | ICD-10-CM

## 2019-09-23 ENCOUNTER — Other Ambulatory Visit

## 2019-09-23 ENCOUNTER — Encounter

## 2019-09-27 ENCOUNTER — Ambulatory Visit
Admission: RE | Admit: 2019-09-27 | Discharge: 2019-09-27 | Disposition: A | Discharge status: Home / Self Care | Source: Ambulatory Visit | Attending: Obstetrics and Gynecology | Admitting: Obstetrics and Gynecology

## 2019-09-27 ENCOUNTER — Other Ambulatory Visit: Payer: Self-pay

## 2019-09-27 ENCOUNTER — Other Ambulatory Visit: Payer: Self-pay | Admitting: Obstetrics and Gynecology

## 2019-09-27 DIAGNOSIS — R928 Other abnormal and inconclusive findings on diagnostic imaging of breast: Secondary | ICD-10-CM

## 2019-10-01 ENCOUNTER — Other Ambulatory Visit: Payer: Self-pay

## 2019-10-01 ENCOUNTER — Other Ambulatory Visit: Payer: Self-pay | Admitting: Diagnostic Radiology

## 2019-10-01 ENCOUNTER — Ambulatory Visit
Admission: RE | Admit: 2019-10-01 | Discharge: 2019-10-01 | Disposition: A | Discharge status: Home / Self Care | Source: Ambulatory Visit | Attending: Obstetrics and Gynecology | Admitting: Obstetrics and Gynecology

## 2019-10-01 ENCOUNTER — Other Ambulatory Visit: Payer: Self-pay | Admitting: Obstetrics and Gynecology

## 2019-10-01 DIAGNOSIS — R928 Other abnormal and inconclusive findings on diagnostic imaging of breast: Secondary | ICD-10-CM

## 2019-10-01 HISTORY — PX: BREAST BIOPSY: SHX20

## 2020-09-11 ENCOUNTER — Other Ambulatory Visit: Payer: Self-pay | Admitting: Obstetrics and Gynecology

## 2020-09-11 DIAGNOSIS — Z1231 Encounter for screening mammogram for malignant neoplasm of breast: Secondary | ICD-10-CM

## 2020-11-01 ENCOUNTER — Other Ambulatory Visit: Payer: Self-pay

## 2020-11-01 ENCOUNTER — Ambulatory Visit
Admission: RE | Admit: 2020-11-01 | Discharge: 2020-11-01 | Disposition: A | Discharge status: Home / Self Care | Source: Ambulatory Visit | Attending: Obstetrics and Gynecology | Admitting: Obstetrics and Gynecology

## 2020-11-01 DIAGNOSIS — Z1231 Encounter for screening mammogram for malignant neoplasm of breast: Secondary | ICD-10-CM

## 2020-12-31 ENCOUNTER — Emergency Department (HOSPITAL_BASED_OUTPATIENT_CLINIC_OR_DEPARTMENT_OTHER)

## 2020-12-31 ENCOUNTER — Inpatient Hospital Stay (HOSPITAL_BASED_OUTPATIENT_CLINIC_OR_DEPARTMENT_OTHER)
Admission: EM | Admit: 2020-12-31 | Discharge: 2021-01-04 | DRG: 378 | Disposition: A | Discharge status: Home / Self Care | Attending: Internal Medicine | Admitting: Internal Medicine

## 2020-12-31 ENCOUNTER — Encounter (HOSPITAL_BASED_OUTPATIENT_CLINIC_OR_DEPARTMENT_OTHER): Payer: Self-pay | Admitting: Emergency Medicine

## 2020-12-31 ENCOUNTER — Other Ambulatory Visit: Payer: Self-pay

## 2020-12-31 DIAGNOSIS — K8020 Calculus of gallbladder without cholecystitis without obstruction: Secondary | ICD-10-CM | POA: Diagnosis present

## 2020-12-31 DIAGNOSIS — E785 Hyperlipidemia, unspecified: Secondary | ICD-10-CM | POA: Diagnosis present

## 2020-12-31 DIAGNOSIS — K5792 Diverticulitis of intestine, part unspecified, without perforation or abscess without bleeding: Secondary | ICD-10-CM | POA: Diagnosis not present

## 2020-12-31 DIAGNOSIS — Z79890 Hormone replacement therapy (postmenopausal): Secondary | ICD-10-CM | POA: Diagnosis not present

## 2020-12-31 DIAGNOSIS — D249 Benign neoplasm of unspecified breast: Secondary | ICD-10-CM | POA: Diagnosis present

## 2020-12-31 DIAGNOSIS — K219 Gastro-esophageal reflux disease without esophagitis: Secondary | ICD-10-CM

## 2020-12-31 DIAGNOSIS — E039 Hypothyroidism, unspecified: Secondary | ICD-10-CM

## 2020-12-31 DIAGNOSIS — K5733 Diverticulitis of large intestine without perforation or abscess with bleeding: Principal | ICD-10-CM | POA: Diagnosis present

## 2020-12-31 DIAGNOSIS — K922 Gastrointestinal hemorrhage, unspecified: Secondary | ICD-10-CM | POA: Diagnosis present

## 2020-12-31 DIAGNOSIS — Z885 Allergy status to narcotic agent status: Secondary | ICD-10-CM

## 2020-12-31 DIAGNOSIS — E119 Type 2 diabetes mellitus without complications: Secondary | ICD-10-CM | POA: Diagnosis not present

## 2020-12-31 DIAGNOSIS — I700 Atherosclerosis of aorta: Secondary | ICD-10-CM | POA: Diagnosis present

## 2020-12-31 DIAGNOSIS — K625 Hemorrhage of anus and rectum: Secondary | ICD-10-CM | POA: Diagnosis present

## 2020-12-31 DIAGNOSIS — D62 Acute posthemorrhagic anemia: Secondary | ICD-10-CM

## 2020-12-31 DIAGNOSIS — Z79899 Other long term (current) drug therapy: Secondary | ICD-10-CM | POA: Diagnosis not present

## 2020-12-31 DIAGNOSIS — Z8371 Family history of colonic polyps: Secondary | ICD-10-CM

## 2020-12-31 DIAGNOSIS — Z801 Family history of malignant neoplasm of trachea, bronchus and lung: Secondary | ICD-10-CM

## 2020-12-31 DIAGNOSIS — Z881 Allergy status to other antibiotic agents status: Secondary | ICD-10-CM

## 2020-12-31 DIAGNOSIS — F32A Depression, unspecified: Secondary | ICD-10-CM | POA: Diagnosis present

## 2020-12-31 DIAGNOSIS — E11638 Type 2 diabetes mellitus with other oral complications: Secondary | ICD-10-CM

## 2020-12-31 DIAGNOSIS — K648 Other hemorrhoids: Secondary | ICD-10-CM | POA: Diagnosis present

## 2020-12-31 DIAGNOSIS — I10 Essential (primary) hypertension: Secondary | ICD-10-CM | POA: Diagnosis not present

## 2020-12-31 DIAGNOSIS — K5790 Diverticulosis of intestine, part unspecified, without perforation or abscess without bleeding: Secondary | ICD-10-CM | POA: Diagnosis not present

## 2020-12-31 DIAGNOSIS — Z20822 Contact with and (suspected) exposure to covid-19: Secondary | ICD-10-CM | POA: Diagnosis not present

## 2020-12-31 LAB — CBC WITH DIFFERENTIAL/PLATELET
Abs Immature Granulocytes: 0.02 10*3/uL (ref 0.00–0.07)
Basophils Absolute: 0.1 10*3/uL (ref 0.0–0.1)
Basophils Relative: 1 %
Eosinophils Absolute: 0.1 10*3/uL (ref 0.0–0.5)
Eosinophils Relative: 1 %
HCT: 41.1 % (ref 36.0–46.0)
Hemoglobin: 14.4 g/dL (ref 12.0–15.0)
Immature Granulocytes: 0 %
Lymphocytes Relative: 34 %
Lymphs Abs: 2.7 10*3/uL (ref 0.7–4.0)
MCH: 31 pg (ref 26.0–34.0)
MCHC: 35 g/dL (ref 30.0–36.0)
MCV: 88.4 fL (ref 80.0–100.0)
Monocytes Absolute: 0.4 10*3/uL (ref 0.1–1.0)
Monocytes Relative: 5 %
Neutro Abs: 4.7 10*3/uL (ref 1.7–7.7)
Neutrophils Relative %: 59 %
Platelets: 322 10*3/uL (ref 150–400)
RBC: 4.65 MIL/uL (ref 3.87–5.11)
RDW: 11.9 % (ref 11.5–15.5)
WBC: 7.9 10*3/uL (ref 4.0–10.5)
nRBC: 0 % (ref 0.0–0.2)

## 2020-12-31 LAB — RESP PANEL BY RT-PCR (FLU A&B, COVID) ARPGX2
Influenza A by PCR: NEGATIVE
Influenza B by PCR: NEGATIVE
SARS Coronavirus 2 by RT PCR: NEGATIVE

## 2020-12-31 LAB — GLUCOSE, CAPILLARY: Glucose-Capillary: 209 mg/dL — ABNORMAL HIGH (ref 70–99)

## 2020-12-31 LAB — COMPREHENSIVE METABOLIC PANEL
ALT: 33 U/L (ref 0–44)
AST: 23 U/L (ref 15–41)
Albumin: 3.9 g/dL (ref 3.5–5.0)
Alkaline Phosphatase: 75 U/L (ref 38–126)
Anion gap: 9 (ref 5–15)
BUN: 15 mg/dL (ref 6–20)
CO2: 22 mmol/L (ref 22–32)
Calcium: 9.2 mg/dL (ref 8.9–10.3)
Chloride: 107 mmol/L (ref 98–111)
Creatinine, Ser: 0.51 mg/dL (ref 0.44–1.00)
GFR, Estimated: >60 mL/min (ref >60)
Glucose, Bld: 154 mg/dL — ABNORMAL HIGH (ref 70–99)
Potassium: 3.8 mmol/L (ref 3.5–5.1)
Sodium: 138 mmol/L (ref 135–145)
Total Bilirubin: 0.4 mg/dL (ref 0.3–1.2)
Total Protein: 7.2 g/dL (ref 6.5–8.1)

## 2020-12-31 LAB — OCCULT BLOOD X 1 CARD TO LAB, STOOL: Fecal Occult Bld: POSITIVE — AB

## 2020-12-31 LAB — LIPASE, BLOOD: Lipase: 32 U/L (ref 11–51)

## 2020-12-31 LAB — HEMOGLOBIN: Hemoglobin: 13.6 g/dL (ref 12.0–15.0)

## 2020-12-31 MED ORDER — SODIUM CHLORIDE 0.9 % IV SOLN: INTRAVENOUS | Status: DC

## 2020-12-31 MED ORDER — METOPROLOL SUCCINATE ER 25 MG PO TB24
25.0000 mg | ORAL_TABLET | Freq: Every day | ORAL | Status: DC
  Administered 2021-01-01 – 2021-01-04 (×4): 25 mg via ORAL
  Filled 2020-12-31 (×4): qty 1

## 2020-12-31 MED ORDER — ALPRAZOLAM 0.5 MG PO TABS
0.5000 mg | ORAL_TABLET | Freq: Every day | ORAL | Status: DC | PRN
  Administered 2021-01-01 – 2021-01-03 (×4): 0.5 mg via ORAL
  Filled 2020-12-31 (×4): qty 1

## 2020-12-31 MED ORDER — SODIUM CHLORIDE 0.9 % IV BOLUS
1000.0000 mL | Freq: Once | INTRAVENOUS | Status: AC
  Administered 2020-12-31: 1000 mL via INTRAVENOUS

## 2020-12-31 MED ORDER — ACETAMINOPHEN 325 MG PO TABS
650.0000 mg | ORAL_TABLET | Freq: Four times a day (QID) | ORAL | Status: DC | PRN
  Administered 2021-01-02: 650 mg via ORAL
  Filled 2020-12-31: qty 2

## 2020-12-31 MED ORDER — ONDANSETRON HCL 4 MG/2ML IJ SOLN: 4.0000 mg | Freq: Four times a day (QID) | INTRAMUSCULAR | Status: DC | PRN

## 2020-12-31 MED ORDER — ONDANSETRON HCL 4 MG PO TABS: 4.0000 mg | ORAL_TABLET | Freq: Four times a day (QID) | ORAL | Status: DC | PRN

## 2020-12-31 MED ORDER — ACETAMINOPHEN 650 MG RE SUPP: 650.0000 mg | Freq: Four times a day (QID) | RECTAL | Status: DC | PRN

## 2020-12-31 MED ORDER — ROSUVASTATIN CALCIUM 10 MG PO TABS
10.0000 mg | ORAL_TABLET | Freq: Every day | ORAL | Status: DC
  Administered 2021-01-01 – 2021-01-04 (×4): 10 mg via ORAL
  Filled 2020-12-31 (×4): qty 1

## 2020-12-31 MED ORDER — LEVOTHYROXINE SODIUM 100 MCG PO TABS
100.0000 ug | ORAL_TABLET | Freq: Every day | ORAL | Status: DC
  Administered 2021-01-01 – 2021-01-04 (×4): 100 ug via ORAL
  Filled 2020-12-31 (×4): qty 1

## 2020-12-31 MED ORDER — PIPERACILLIN-TAZOBACTAM 3.375 G IVPB 30 MIN
3.3750 g | Freq: Four times a day (QID) | INTRAVENOUS | Status: DC
  Filled 2020-12-31: qty 50

## 2020-12-31 MED ORDER — PIPERACILLIN-TAZOBACTAM 3.375 G IVPB 30 MIN
3.3750 g | Freq: Once | INTRAVENOUS | Status: AC
  Administered 2020-12-31: 3.375 g via INTRAVENOUS
  Filled 2020-12-31: qty 50

## 2020-12-31 MED ORDER — PIPERACILLIN-TAZOBACTAM 3.375 G IVPB
3.3750 g | Freq: Three times a day (TID) | INTRAVENOUS | Status: DC
  Administered 2020-12-31 – 2021-01-03 (×8): 3.375 g via INTRAVENOUS
  Filled 2020-12-31 (×8): qty 50

## 2020-12-31 MED ORDER — INSULIN ASPART 100 UNIT/ML IJ SOLN
0.0000 [IU] | Freq: Three times a day (TID) | INTRAMUSCULAR | Status: DC
  Administered 2021-01-01 (×3): 3 [IU] via SUBCUTANEOUS
  Administered 2021-01-02 (×2): 2 [IU] via SUBCUTANEOUS
  Administered 2021-01-02: 5 [IU] via SUBCUTANEOUS
  Administered 2021-01-03 (×3): 3 [IU] via SUBCUTANEOUS
  Administered 2021-01-04 (×2): 5 [IU] via SUBCUTANEOUS

## 2020-12-31 MED ORDER — CITALOPRAM HYDROBROMIDE 20 MG PO TABS
40.0000 mg | ORAL_TABLET | Freq: Every day | ORAL | Status: DC
  Administered 2021-01-01 – 2021-01-04 (×4): 40 mg via ORAL
  Filled 2020-12-31 (×4): qty 2

## 2020-12-31 MED ORDER — LORATADINE 10 MG PO TABS
10.0000 mg | ORAL_TABLET | Freq: Every day | ORAL | Status: DC
  Administered 2021-01-01 – 2021-01-04 (×4): 10 mg via ORAL
  Filled 2020-12-31 (×4): qty 1

## 2020-12-31 NOTE — ED Triage Notes (Signed)
Pt reports blood in stool onset today. Pt reports on Wednesday and Thursday having abdominal cramping. Pt has history of diverticulitis.

## 2020-12-31 NOTE — H&P (Signed)
History and Physical    Kristen Conner NKN:[id number] DOB: 04-May-1966 DOA: 12/31/2020  PCP: Ginger Organ., MD   Patient coming from: [facility]   Chief Complaint: lower GI bleed.   HPI: Kristen Conner is a 55 y.o. female with medical history significant of diverticulosis, T2DM, hypothyroid, HTN, depression and GERD who presented with hematochezia.  Patient reports 3 days of lower abdominal pain, colicky in nature, moderate in intensity, intermittent, with no improving or worsening factors.  Today she developed hematochezia, bright red blood per rectum.  So far she had about 4 episodes, the first 1 occurred after moving her bowels and she noticed blood streaks in the paper towel, the following 3 episodes she had bright red blood per rectum with blood clots.  Denies any lightheadedness or dizziness.  She had a colonoscopy about 3 years ago, diagnosed with diverticulosis.   ED Course: Initially evaluated at [facility], her hemoglobin was found to be 14, CT of the abdomen with mild diverticulitis.  She received 1 dose of antibiotic therapy, GI was contacted and patient was transferred to [facility] for further management.  Review of Systems:  1. General: No fevers, no chills, no weight gain or weight loss 2. ENT: No runny nose or sore throat, no hearing disturbances 3. Pulmonary: No dyspnea, cough, wheezing, or hemoptysis 4. Cardiovascular: No angina, claudication, lower extremity edema, pnd or orthopnea 5. Gastrointestinal: No nausea or vomiting, positive colicky lower abdominal pain with hematochezia.  6. Hematology: No easy bruisability or frequent infections 7. Urology: No dysuria, hematuria or increased urinary frequency 8. Dermatology: No rashes. 9. Neurology: No seizures or paresthesias 10. Musculoskeletal: No joint pain or deformities  Past Medical History:  Diagnosis Date  . Allergy   . Cataract    rt. eye  . Depression   . Diabetes mellitus without  complication ([place])   . Diverticulitis   . Fibroid tumor   . GERD (gastroesophageal reflux disease)   . Heart murmur   . Hypertension   . Hypothyroid   . Kidney stone   . Ovarian cyst   . Reflux   . RUQ abdominal pain     Past Surgical History:  Procedure Laterality Date  . BREAST BIOPSY Right 10/01/2019  . BREAST EXCISIONAL BIOPSY Left 2002  . COLONOSCOPY    . DILATION AND CURETTAGE OF UTERUS    . ENDOMETRIAL ABLATION    . EXPLORATORY LAPAROTOMY    . LIPOSUCTION    . TONSILLECTOMY    . TUBAL LIGATION       reports that she has never smoked. She has never used smokeless tobacco. She reports current alcohol use. She reports that she does not use drugs.  Allergies  Allergen Reactions  . Demerol   . Ketek [Telithromycin]     Family History  Problem Relation Age of Onset  . Colon polyps Mother   . Lung cancer Father   . Colon cancer Neg Hx   . Esophageal cancer Neg Hx   . Rectal cancer Neg Hx   . Stomach cancer Neg Hx      Prior to Admission medications   Medication Sig Start Date End Date Taking? Authorizing Provider  Canagliflozin (INVOKANA PO) Take 1 capsule by mouth daily.    [provider]  citalopram (CELEXA) 40 MG tablet Take 40 mg by mouth daily.    [provider]  ergocalciferol (VITAMIN D2) 50000 UNITS capsule Take 50,000 Units by mouth once a week.  [provider]  Exenatide (BYDUREON [place]) Inject into the skin. 1 injection weekly Sundays    [provider]  levothyroxine (SYNTHROID, LEVOTHROID) 100 MCG tablet Take 100 mcg by mouth daily.    [provider]  losartan (COZAAR) 50 MG tablet Take 50 mg by mouth daily.    [provider]    Physical Exam: Vitals:   12/31/20 1530 12/31/20 1600 12/31/20 1620 12/31/20 1706  BP: 122/78 132/81  (!) 141/84  Pulse: 90 89  85  Resp: 19 16  16   Temp:   98.7 F (37.1 C) 98.8 F (37.1 C)  TempSrc:   Oral Oral  SpO2: 99% 99%  99%  Weight:      Height:         Vitals:   12/31/20 1530 12/31/20 1600 12/31/20 1620 12/31/20 1706  BP: 122/78 132/81  (!) 141/84  Pulse: 90 89  85  Resp: 19 16  16   Temp:   98.7 F (37.1 C) 98.8 F (37.1 C)  TempSrc:   Oral Oral  SpO2: 99% 99%  99%  Weight:      Height:       General: Not in pain or dyspnea  Neurology: Awake and alert, non focal Head and Neck. Head normocephalic. Neck supple with no adenopathy or thyromegaly.   E ENT: no pallor, no icterus, oral mucosa moist Cardiovascular: No JVD. S1-S2 present, rhythmic, no gallops, rubs, or murmurs. No lower extremity edema. Pulmonary: positive breath sounds bilaterally, adequate air movement, no wheezing, rhonchi or rales. Gastrointestinal. Abdomen soft, non distended and not tender to superficial palpation  Skin. No rashes Musculoskeletal: no joint deformities    Labs on Admission: I have personally reviewed following labs and imaging studies  CBC: Recent Labs  Lab 12/31/20 1320 12/31/20 1715  WBC 7.9  --   NEUTROABS 4.7  --   HGB 14.4 13.6  HCT 41.1  --   MCV 88.4  --   PLT 322  --    Basic Metabolic Panel: Recent Labs  Lab 12/31/20 1320  NA 138  K 3.8  CL 107  CO2 22  GLUCOSE 154*  BUN 15  CREATININE 0.51  CALCIUM 9.2   GFR: Estimated Creatinine Clearance: 83.9 mL/min (by C-G formula based on SCr of 0.51 mg/dL). Liver Function Tests: Recent Labs  Lab 12/31/20 1320  AST 23  ALT 33  ALKPHOS 75  BILITOT 0.4  PROT 7.2  ALBUMIN 3.9   Recent Labs  Lab 12/31/20 1320  LIPASE 32   No results for input(s): AMMONIA in the last 168 hours. Coagulation Profile: No results for input(s): INR, PROTIME in the last 168 hours. Cardiac Enzymes: No results for input(s): CKTOTAL, CKMB, CKMBINDEX, TROPONINI in the last 168 hours. BNP (last 3 results) No results for input(s): PROBNP in the last 8760 hours. HbA1C: No results for input(s): HGBA1C in the last 72 hours. CBG: No results for input(s): GLUCAP in the last 168  hours. Lipid Profile: No results for input(s): CHOL, HDL, LDLCALC, TRIG, CHOLHDL, LDLDIRECT in the last 72 hours. Thyroid Function Tests: No results for input(s): TSH, T4TOTAL, FREET4, T3FREE, THYROIDAB in the last 72 hours. Anemia Panel: No results for input(s): VITAMINB12, FOLATE, FERRITIN, TIBC, IRON, RETICCTPCT in the last 72 hours. Urine analysis:    Component Value Date/Time   COLORURINE YELLOW 12/30/2011 1026   APPEARANCEUR CLOUDY (A) 12/30/2011 1026   LABSPEC 1.022 12/30/2011 1026   PHURINE 5.5 12/30/2011 1026   GLUCOSEU NEGATIVE 12/30/2011 1026  HGBUR NEGATIVE 12/30/2011 [place] 12/30/2011 1026   KETONESUR NEGATIVE 12/30/2011 1026   PROTEINUR NEGATIVE 12/30/2011 1026   UROBILINOGEN 0.2 12/30/2011 1026   NITRITE NEGATIVE 12/30/2011 1026   LEUKOCYTESUR NEGATIVE 12/30/2011 1026    Radiological Exams on Admission: CT ABDOMEN PELVIS WO CONTRAST  Result Date: 12/31/2020 CLINICAL DATA:  55 year old female with history of blood in stool today. Suspected diverticulitis. EXAM: CT ABDOMEN AND PELVIS WITHOUT CONTRAST TECHNIQUE: Multidetector CT imaging of the abdomen and pelvis was performed following the standard protocol without IV contrast. COMPARISON:  CT the abdomen and pelvis 12/30/2011. FINDINGS: Lower chest: Unremarkable. Hepatobiliary: No definite suspicious cystic or solid hepatic lesions are confidently identified on today's noncontrast CT examination. Tiny calcified gallstone lying dependently in the gallbladder. No findings to suggest an acute cholecystitis at this time. Pancreas: No definite pancreatic mass or peripancreatic fluid collections or inflammatory changes are noted on today's noncontrast CT examination. Spleen: Unremarkable. Adrenals/Urinary Tract: There are no abnormal calcifications within the collecting system of either kidney, along the course of either ureter, or within the lumen of the urinary bladder. Exophytic 1.7 cm low-attenuation lesion  in the upper pole of the left kidney, incompletely characterized on today's non-contrast CT examination, but favored to represent a small cyst. Right kidney and bilateral adrenal glands are otherwise normal in appearance. Urinary bladder is normal in appearance. Bilateral adrenal glands are normal in appearance. Stomach/Bowel: Unenhanced appearance of the stomach is normal. No pathologic dilatation of small bowel or colon. Numerous colonic diverticulae are noted. In the region of the splenic flexure (axial image 19 of series 2) there are subtle inflammatory changes adjacent to a diverticulum. No discrete diverticular abscess or frank perforation. Normal appendix. Vascular/Lymphatic: Aortic atherosclerosis. No lymphadenopathy noted in the abdomen or pelvis. Reproductive: Uterus and ovaries are are unremarkable in appearance. Other: No significant volume of ascites noted in the visualized portions of the peritoneal cavity. Musculoskeletal: There are no aggressive appearing lytic or blastic lesions noted in the visualized portions of the skeleton. IMPRESSION: 1. Subtle changes of acute diverticulitis in the region of the splenic flexure of the colon. No diverticular abscess or signs of frank perforation are noted at this time. 2. Cholelithiasis without evidence of acute cholecystitis. 3. Aortic atherosclerosis. 4. Additional incidental findings, as above. Electronically Signed   By: Vinnie Langton M.D.   On: 12/31/2020 13:52    EKG: Independently reviewed. NA  Assessment/Plan Principal Problem:   Diverticulitis Active Problems:   GI bleed   Diverticulosis   Hypothyroid   T2DM (type 2 diabetes mellitus) ([place])   Acute blood loss anemia   55 year old female with past medical history for diverticulosis, recent colonoscopy about 2 years ago who presented with worsening hematochezia.  Bleeding has been preceded by 3 days of colicky lower abdominal pain, no fevers, no chills, no nausea, vomiting or diarrhea.   On her initial physical examination, temperature 98.8, blood pressure 141/84, heart rate 85, respiratory rate 16, oxygen saturation 99% on room air.  Her lungs are clear to auscultation, heart S1-S2, present, rhythmic, soft abdomen, nontender to superficial palpation, no lower extremity edema.  Sodium 138, potassium 3.8, chloride 107, bicarb 22, glucose 154, BUN 15, creatinine 0.51, AST 23, ALT 33, white count 7.9, hemoglobin 14.4, hematocrit 41.1, platelets 322. SARS COVID-19 negative.  CT of the abdomen and pelvis with subtle changes of acute diverticulitis in the region of the splenic flexure of the colon.  No other diverticular abscess or signs of frank perforation.  Cholelithiasis without acute cholecystitis.  Aortic atherosclerosis.  [name] is being admitted to the hospital with a working diagnosis of acute diverticulitis, complicated with acute blood loss anemia  1.  Acute diverticulitis with lower GI bleed, acute blood loss anemia.  Patient admitted to the telemetry ward, continue supportive medical therapy with intravenous antibiotic therapy, Zosyn, and intravenous fluids with isotonic saline.  Clear liquid diet for now.  Close follow-up hemoglobin and hematocrit. Plan to transfuse PRBC when hemoglobin less than 7.   2.  Hypertension.  Continue blood pressure monitoring, hold on losartan for now, but continue with metoprolol to prevent rebound hypertension.  3.  Type 2 diabetes mellitus.  Continue clear liquid diet, glucose coverage and monitoring with insulin sliding scale.  Hold on oral hypoglycemic agents, Jardiance.  Hold on semaglutide.  4.  Hypothyroidism.  Continue levothyroxine.  5.  Depression.  Continue citalopram and alprazolam..  Status is: Observation  The patient remains OBS appropriate and will d/c before 2 midnights.  Dispo: The patient is from: Home              Anticipated d/c is to: Home              Patient currently is not medically stable to  d/c.   Difficult to place patient No       DVT prophylaxis: scd   Code Status:    full  Family Communication:  No family at the bedside    Consults called: GI   Admission status:  Observation   Catera Hankins Gerome Apley MD [facility]   12/31/2020, 5:37 PM

## 2020-12-31 NOTE — Consult Note (Addendum)
[facility] Consult: 3:29 PM 12/31/2020  LOS: 0 days    Referring Provider: Dr Billy Fischer in [facility] ED.  Dr Erlinda Hong hospitalist.   Primary Care Physician:  Ginger Organ., MD Primary Gastroenterologist:  Dr. Billie Lade    Reason for Consultation:  hematochezia   HPI: Kristen Conner is a 55 y.o. female.  PMH NIDDM.  Hypothyroidism.  Hypertension.  Breast fibroadenoma. Average risk screening colonoscopy 05/2017: 6 and 10 mm sessile polyps removed..  Pandiverticulosis.  Nonbleeding, small internal hemorrhoids. Path: 2 of 5 fragments were hyperplastic, 1 of 5 fragments or leiomyoma, 3 of 5 fragments were benign colonic mucosa.    Last week on Wednesday and Thursday she had some cramping discomfort in her left abdomen but normal-appearing stools.  Also experienced spells of sweats and nausea but no vomiting.  Felt well on Saturday, brown stool.  Around 8 AM yesterday, Sunday she passed a lot of gas and then passed bright red blood into the commode.  She felt okay afterwards and went shopping with her daughter.  At [facility] she had her second large bowel movement with bright red blood.  Brought to the ED and had a third bowel movement which was also bloody.  Smaller bowel movement, bloody last night.  2 more deep maroon-colored stools this morning and the volume of bleeding has diminished.  Left lower quadrant cramping persists in an ebb and flow nature and is worse right before bowel movements.  Nausea and sweats overnight.  No use of NSAIDs or ASA generally but did use 1 Naprosyn for some body aches a couple of weeks ago.  BP readings initially hypertensive at 130s to 140s/91-102.  Settled down to 120s over 80s.  Initial heart rate 104, on subsequent rechecks in the 80s and 90s. Hgb 14.4 >> 11.4.  Remote comp level of  15.1 in 2013.  MCV, platelets, WBCs within normal limits.  Glucose 166.  Electrolytes, LFTs, renal function normal.  FOBT positive. COVID-19 negative. CTAP w/o contrast: Subtle changes of acute diverticulitis at the splenic flexure without abscess or perforation.  Uncomplicated cholelithiasis.  Aortic atherosclerosis. Zosyn initiated.    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  Past Medical History:  Diagnosis Date  . Allergy   . Cataract    rt. eye  . Depression   . Diabetes mellitus without complication ([place])   . Diverticulitis   . Fibroid tumor   . GERD (gastroesophageal reflux disease)   . Heart murmur   . Hypertension   . Hypothyroid   . Kidney stone   . Ovarian cyst   . Reflux   . RUQ abdominal pain     Past Surgical History:  Procedure Laterality Date  . BREAST BIOPSY Right 10/01/2019  . BREAST EXCISIONAL BIOPSY Left 2002  . COLONOSCOPY    . DILATION AND CURETTAGE OF UTERUS    . ENDOMETRIAL ABLATION    . EXPLORATORY LAPAROTOMY    . LIPOSUCTION    .  TONSILLECTOMY    . TUBAL LIGATION      Prior to Admission medications   Medication Sig Start Date End Date Taking? Authorizing Provider  Canagliflozin (INVOKANA PO) Take 1 capsule by mouth daily.    [provider]  citalopram (CELEXA) 40 MG tablet Take 40 mg by mouth daily.    [provider]  ergocalciferol (VITAMIN D2) 50000 UNITS capsule Take 50,000 Units by mouth once a week.    [provider]  Exenatide (BYDUREON [place]) Inject into the skin. 1 injection weekly Sundays    [provider]  levothyroxine (SYNTHROID, LEVOTHROID) 100 MCG tablet Take 100 mcg by mouth daily.    [provider]  losartan (COZAAR) 50 MG tablet Take 50 mg by mouth daily.    [provider]    Scheduled Meds:  Infusions: . piperacillin-tazobactam 3.375 g (12/31/20 1500)   PRN  Meds:    Allergies as of 12/31/2020 - Review Complete 12/31/2020  Allergen Reaction Noted  . Demerol  10/03/2010  . Ketek [telithromycin]  05/20/2017    Family History  Problem Relation Age of Onset  . Colon polyps Mother   . Lung cancer Father   . Colon cancer Neg Hx   . Esophageal cancer Neg Hx   . Rectal cancer Neg Hx   . Stomach cancer Neg Hx     Social History   Socioeconomic History  . Marital status: Widowed    Spouse name: Not on file  . Number of children: Not on file  . Years of education: Not on file  . Highest education level: Not on file  Occupational History  . Not on file  Tobacco Use  . Smoking status: Never Smoker  . Smokeless tobacco: Never Used  Vaping Use  . Vaping Use: Never used  Substance and Sexual Activity  . Alcohol use: Yes    Comment: rarely  once a month  . Drug use: No  . Sexual activity: Not on file  Other Topics Concern  . Not on file  Social History Narrative  . Not on file   Social Determinants of Health   Financial Resource Strain: Not on file  Food Insecurity: Not on file  Transportation Needs: Not on file  Physical Activity: Not on file  Stress: Not on file  Social Connections: Not on file  Intimate Partner Violence: Not on file    REVIEW OF SYSTEMS: Constitutional: Feeling better today.  No profound weakness or fatigue ENT:  No nose bleeds Pulm: No shortness of breath or cough. CV:  No palpitations, no LE edema.  GU:  No hematuria, no frequency GI: See HPI. Heme: Other than the GI bleeding, no unusual or excessive bleeding or bruising. Transfusions: None. Neuro:  No headaches, no peripheral tingling or numbness.  No syncope, no seizures. Derm:  No itching, no rash or sores.  Endocrine:  No sweats or chills.  No polyuria or dysuria Immunization: Vaccination record reviewed. Travel:  None beyond local counties in last few months.    PHYSICAL EXAM: Vital signs in last 24 hours: Vitals:   12/31/20 1445  12/31/20 1500  BP: 119/76 127/81  Pulse: 91 88  Resp:  20  Temp:    SpO2: 99% 100%   Wt Readings from Last 3 Encounters:  12/31/20 87.1 kg  05/30/17 92.1 kg  05/20/17 92.4 kg    General: Pleasant, well-appearing, comfortable Head: No facial asymmetry or swelling.  No signs of head trauma. Eyes: No conjunctival pallor or  scleral icterus. Ears: Not hard of hearing Nose: No congestion or discharge Mouth: Good dentition.  Tongue midline.  Mucosa pink, moist, clear. Neck: No JVD, thyromegaly, masses, bruits. Lungs: Clear bilaterally without labored breathing Heart: RRR.  No MRG.  S1, S2 present Abdomen: Soft, nondistended.  Active bowel sounds.  No HSM, masses, bruits, hernias.  Minor tenderness in the left lower quadrant and left mid abdomen without guarding or rebound.   Rectal: Deferred Musc/Skeltl: No joint redness, swelling or gross deformity Extremities: No CCE. Neurologic: Fully alert and fully oriented.  Able to provide excellent history.  Moves all 4 limbs without tremor or weakness. Skin: No rashes, no sores, no bruising, no suspicious lesions Nodes: No cervical adenopathy Psych: Pleasant, calm, cooperative, fluid speech.  Intake/Output from previous day: No intake/output data recorded. Intake/Output this shift: No intake/output data recorded.  LAB RESULTS: Recent Labs    12/31/20 1320  WBC 7.9  HGB 14.4  HCT 41.1  PLT 322   BMET Lab Results  Component Value Date   NA 138 12/31/2020   NA 141 12/30/2011   K 3.8 12/31/2020   K 3.4 (L) 12/30/2011   CL 107 12/31/2020   CL 105 12/30/2011   CO2 22 12/31/2020   CO2 27 12/30/2011   GLUCOSE 154 (H) 12/31/2020   GLUCOSE 122 (H) 12/30/2011   BUN 15 12/31/2020   BUN 9 12/30/2011   CREATININE 0.51 12/31/2020   CREATININE 0.60 12/30/2011   CALCIUM 9.2 12/31/2020   CALCIUM 9.5 12/30/2011   LFT Recent Labs    12/31/20 1320  PROT 7.2  ALBUMIN 3.9  AST 23  ALT 33  ALKPHOS 75  BILITOT 0.4   PT/INR No  results found for: INR, PROTIME Hepatitis Panel No results for input(s): HEPBSAG, HCVAB, HEPAIGM, HEPBIGM in the last 72 hours. C-Diff No components found for: CDIFF Lipase     Component Value Date/Time   LIPASE 32 12/31/2020 1320    Drugs of Abuse  No results found for: LABOPIA, COCAINSCRNUR, LABBENZ, AMPHETMU, THCU, LABBARB   RADIOLOGY STUDIES: CT ABDOMEN PELVIS WO CONTRAST  Result Date: 12/31/2020 CLINICAL DATA:  55 year old female with history of blood in stool today. Suspected diverticulitis. EXAM: CT ABDOMEN AND PELVIS WITHOUT CONTRAST TECHNIQUE: Multidetector CT imaging of the abdomen and pelvis was performed following the standard protocol without IV contrast. COMPARISON:  CT the abdomen and pelvis 12/30/2011. FINDINGS: Lower chest: Unremarkable. Hepatobiliary: No definite suspicious cystic or solid hepatic lesions are confidently identified on today's noncontrast CT examination. Tiny calcified gallstone lying dependently in the gallbladder. No findings to suggest an acute cholecystitis at this time. Pancreas: No definite pancreatic mass or peripancreatic fluid collections or inflammatory changes are noted on today's noncontrast CT examination. Spleen: Unremarkable. Adrenals/Urinary Tract: There are no abnormal calcifications within the collecting system of either kidney, along the course of either ureter, or within the lumen of the urinary bladder. Exophytic 1.7 cm low-attenuation lesion in the upper pole of the left kidney, incompletely characterized on today's non-contrast CT examination, but favored to represent a small cyst. Right kidney and bilateral adrenal glands are otherwise normal in appearance. Urinary bladder is normal in appearance. Bilateral adrenal glands are normal in appearance. Stomach/Bowel: Unenhanced appearance of the stomach is normal. No pathologic dilatation of small bowel or colon. Numerous colonic diverticulae are noted. In the region of the splenic flexure (axial  image 19 of series 2) there are subtle inflammatory changes adjacent to a diverticulum. No discrete diverticular abscess or frank perforation. Normal  appendix. Vascular/Lymphatic: Aortic atherosclerosis. No lymphadenopathy noted in the abdomen or pelvis. Reproductive: Uterus and ovaries are are unremarkable in appearance. Other: No significant volume of ascites noted in the visualized portions of the peritoneal cavity. Musculoskeletal: There are no aggressive appearing lytic or blastic lesions noted in the visualized portions of the skeleton. IMPRESSION: 1. Subtle changes of acute diverticulitis in the region of the splenic flexure of the colon. No diverticular abscess or signs of frank perforation are noted at this time. 2. Cholelithiasis without evidence of acute cholecystitis. 3. Aortic atherosclerosis. 4. Additional incidental findings, as above. Electronically Signed   By: Vinnie Langton M.D.   On: 12/31/2020 13:52      IMPRESSION:   *   Hematochezia with left abdominal pain.  Some periods of fever and chills. CT confirming uncomplicated diverticulitis at splenic flexure.  Normally diverticulitis is not associated with bleeding but in this case she is bleeding.   Colonoscopy in 2018 with pandiverticulosis, hyperplastic polyps/leiomyoma and benign colonic mucosa on pathology from removed polyps.    PLAN:     *    Continue supportive care with clear liquids, Zosyn.  *    If aggressively rebleeds would send for CT angiography with aim of embolization.  At present the bleeding is subsiding/slowing down so no role for IR at present.   Repeat Hgb pending for this afternoon and ordered CBC for the morning.   Azucena Freed  12/31/2020, 3:29 PM Phone [phone]  I have reviewed the entire case in detail with the above APP and discussed the plan in detail.  Therefore, I agree with the diagnoses recorded above. In addition,  I have personally interviewed and examined the patient and have  personally reviewed any abdominal/pelvic CT scan images.  My additional thoughts are as follows:  55 year old woman with known diverticulosis, no previous GI bleeding, now with 3 days of crampy abdominal pain of a stuttering course then followed by hematochezia.  Is passing bright red blood with clots, it sounds like it is slowing down. CT scan limited by lack of contrast, there may be some subtle inflammatory changes and a small area of a sigmoid diverticulum. The overall clinical picture is most consistent with diverticular bleeding.  Its not entirely clear if there is truly diverticulitis, since it would be unusual for that to cause bleeding, but we will treat her with antibiotics for now.  In addition, the clinical picture is consistent with ischemic colitis, though she does not have apparent risk factors for that. Hemoglobin has stabilized at 11.5  Conservative management for now with bowel rest/clear liquids, serial hemoglobin and hematocrit checks and close monitoring.  Our service will be on overnight.  Patient has brisk GI bleeding with hemodynamic compromise, please contact the GI service immediately, as arrangements would likely be needed for stat CT angiogram.  Nelida Meuse III [phone]

## 2020-12-31 NOTE — ED Notes (Signed)
Pt informed registration that she had another episode of blood in stool and feeling like she is going to pass out. Pt did not flush toilet so that nurse could look at it. Toilet filled with dark colored blood. On way to room patient reports total of 4 episodes of blood in stool today.

## 2020-12-31 NOTE — ED Provider Notes (Signed)
[place] EMERGENCY DEPARTMENT Provider Note   CSN: [id number] Arrival date & time: 12/31/20  1202     History Chief Complaint  Patient presents with  . Rectal Bleeding    Kristen Conner is a 55 y.o. female.  HPI      55 year old female with a history of diabetes, diverticulitis, hypertension, presenting concern for rectal bleeding.  Reports that on Wednesday and Thursday she had cramping abdominal pain, and had some this morning.  Today, had some cramping like she is going to have diarrhea, however when she had a bowel movement it was only blood without stool.  Reports that she had 4 episodes of passing blood and clots prior to arrival to the hospital, and had 1 on arrival and additional prior to CT.  She denies any lightheadedness, shortness of breath, chest pain.  Denies any significant abdominal pain.  Denies nausea, vomiting, urinary symptoms, syncope.  She is not on anticoagulation.  She sees [facility]. Hilarie Fredrickson [facility].  Past Medical History:  Diagnosis Date  . Allergy   . Cataract    rt. eye  . Depression   . Diabetes mellitus without complication ([place])   . Diverticulitis   . Fibroid tumor   . GERD (gastroesophageal reflux disease)   . Heart murmur   . Hypertension   . Hypothyroid   . Kidney stone   . Ovarian cyst   . Reflux   . RUQ abdominal pain     Patient Active Problem List   Diagnosis Date Noted  . GI bleed 12/31/2020    Past Surgical History:  Procedure Laterality Date  . BREAST BIOPSY Right 10/01/2019  . BREAST EXCISIONAL BIOPSY Left 2002  . COLONOSCOPY    . DILATION AND CURETTAGE OF UTERUS    . ENDOMETRIAL ABLATION    . EXPLORATORY LAPAROTOMY    . LIPOSUCTION    . TONSILLECTOMY    . TUBAL LIGATION       OB History   No obstetric history on file.     Family History  Problem Relation Age of Onset  . Colon polyps Mother   . Lung cancer Father   . Colon cancer Neg Hx   . Esophageal cancer Neg Hx   . Rectal cancer Neg  Hx   . Stomach cancer Neg Hx     Social History   Tobacco Use  . Smoking status: Never Smoker  . Smokeless tobacco: Never Used  Vaping Use  . Vaping Use: Never used  Substance Use Topics  . Alcohol use: Yes    Comment: rarely  once a month  . Drug use: No    Home Medications Prior to Admission medications   Medication Sig Start Date End Date Taking? Authorizing Provider  Canagliflozin (INVOKANA PO) Take 1 capsule by mouth daily.    [provider]  citalopram (CELEXA) 40 MG tablet Take 40 mg by mouth daily.    [provider]  ergocalciferol (VITAMIN D2) 50000 UNITS capsule Take 50,000 Units by mouth once a week.    [provider]  Exenatide (BYDUREON [place]) Inject into the skin. 1 injection weekly Sundays    [provider]  levothyroxine (SYNTHROID, LEVOTHROID) 100 MCG tablet Take 100 mcg by mouth daily.    [provider]  losartan (COZAAR) 50 MG tablet Take 50 mg by mouth daily.    [provider]    Allergies    Demerol and Ketek [telithromycin]  Review of Systems  Review of Systems  Constitutional: Negative for fever.  HENT: Negative for sore throat.   Eyes: Negative for visual disturbance.  Respiratory: Negative for cough and shortness of breath.   Cardiovascular: Negative for chest pain.  Gastrointestinal: Positive for abdominal pain, anal bleeding, blood in stool and nausea. Negative for constipation, diarrhea and vomiting.  Genitourinary: Negative for difficulty urinating.  Musculoskeletal: Negative for back pain and neck pain.  Skin: Negative for rash.  Neurological: Negative for syncope and headaches.    Physical Exam Updated Vital Signs BP 122/78   Pulse 90   Temp 98.1 F (36.7 C) (Oral)   Resp 19   Ht 5\' 4"  (1.626 m)   Wt 87.1 kg   SpO2 99%   BMI 32.96 kg/m   Physical Exam Vitals and nursing note reviewed.  Constitutional:      General: She is not in acute distress.    Appearance: She is  well-developed. She is not diaphoretic.  HENT:     Head: Normocephalic and atraumatic.  Eyes:     Conjunctiva/sclera: Conjunctivae normal.  Cardiovascular:     Rate and Rhythm: Normal rate and regular rhythm.     Heart sounds: Normal heart sounds. No murmur heard. No friction rub. No gallop.   Pulmonary:     Effort: Pulmonary effort is normal. No respiratory distress.     Breath sounds: Normal breath sounds. No wheezing or rales.  Abdominal:     General: There is no distension.     Palpations: Abdomen is soft.     Tenderness: There is no abdominal tenderness. There is no guarding.  Musculoskeletal:        General: No tenderness.     Cervical back: Normal range of motion.  Skin:    General: Skin is warm and dry.     Findings: No erythema or rash.  Neurological:     Mental Status: She is alert and oriented to person, place, and time.     ED Results / Procedures / Treatments   Labs (all labs ordered are listed, but only abnormal results are displayed) Labs Reviewed  COMPREHENSIVE METABOLIC PANEL - Abnormal; Notable for the following components:      Result Value   Glucose, Bld 154 (*)    All other components within normal limits  OCCULT BLOOD X 1 CARD TO LAB, STOOL - Abnormal; Notable for the following components:   Fecal Occult Bld POSITIVE (*)    All other components within normal limits  RESP PANEL BY RT-PCR (FLU A&B, COVID) ARPGX2  CBC WITH DIFFERENTIAL/PLATELET  LIPASE, BLOOD  HEMOGLOBIN  HEMOGLOBIN    EKG None  Radiology CT ABDOMEN PELVIS WO CONTRAST  Result Date: 12/31/2020 CLINICAL DATA:  55 year old female with history of blood in stool today. Suspected diverticulitis. EXAM: CT ABDOMEN AND PELVIS WITHOUT CONTRAST TECHNIQUE: Multidetector CT imaging of the abdomen and pelvis was performed following the standard protocol without IV contrast. COMPARISON:  CT the abdomen and pelvis 12/30/2011. FINDINGS: Lower chest: Unremarkable. Hepatobiliary: No definite  suspicious cystic or solid hepatic lesions are confidently identified on today's noncontrast CT examination. Tiny calcified gallstone lying dependently in the gallbladder. No findings to suggest an acute cholecystitis at this time. Pancreas: No definite pancreatic mass or peripancreatic fluid collections or inflammatory changes are noted on today's noncontrast CT examination. Spleen: Unremarkable. Adrenals/Urinary Tract: There are no abnormal calcifications within the collecting system of either kidney, along the course of either ureter, or within the lumen of the urinary bladder. Exophytic  1.7 cm low-attenuation lesion in the upper pole of the left kidney, incompletely characterized on today's non-contrast CT examination, but favored to represent a small cyst. Right kidney and bilateral adrenal glands are otherwise normal in appearance. Urinary bladder is normal in appearance. Bilateral adrenal glands are normal in appearance. Stomach/Bowel: Unenhanced appearance of the stomach is normal. No pathologic dilatation of small bowel or colon. Numerous colonic diverticulae are noted. In the region of the splenic flexure (axial image 19 of series 2) there are subtle inflammatory changes adjacent to a diverticulum. No discrete diverticular abscess or frank perforation. Normal appendix. Vascular/Lymphatic: Aortic atherosclerosis. No lymphadenopathy noted in the abdomen or pelvis. Reproductive: Uterus and ovaries are are unremarkable in appearance. Other: No significant volume of ascites noted in the visualized portions of the peritoneal cavity. Musculoskeletal: There are no aggressive appearing lytic or blastic lesions noted in the visualized portions of the skeleton. IMPRESSION: 1. Subtle changes of acute diverticulitis in the region of the splenic flexure of the colon. No diverticular abscess or signs of frank perforation are noted at this time. 2. Cholelithiasis without evidence of acute cholecystitis. 3. Aortic  atherosclerosis. 4. Additional incidental findings, as above. Electronically Signed   By: Vinnie Langton M.D.   On: 12/31/2020 13:52    Procedures Procedures   Medications Ordered in ED Medications  piperacillin-tazobactam (ZOSYN) IVPB 3.375 g (0 g Intravenous Stopped 12/31/20 1551)  sodium chloride 0.9 % bolus 1,000 mL (1,000 mLs Intravenous New Bag/Given 12/31/20 1500)    ED Course  I have reviewed the triage vital signs and the nursing notes.  Pertinent labs & imaging results that were available during my care of the patient were reviewed by me and considered in my medical decision making (see chart for details).    MDM Rules/Calculators/A&P                          55 year old female with a history of diabetes, diverticulitis, hypertension, presenting concern for rectal bleeding.  History and exam are most consistent with a lower GI bleed, consistent with likely diverticular bleed.  She remains hemodynamically stable, and has a normal hemoglobin at this time.  Evaluation was completed in the time of a global IV contrast shortage.  CT was completed given abdominal pain, and also shows some findings consistent with possible mild diverticulitis.  Discussed this with hospitalist, who recommends IV antibiotics.  Discussed with Nancy Marus of [facility], who recommends that the hospitalist team consult them on patient's arrival to the hospital.   Final Clinical Impression(s) / ED Diagnoses Final diagnoses:  Lower GI bleed  Diverticulitis    Rx / DC Orders ED Discharge Orders    None       Gareth Morgan, MD 12/31/20 1558

## 2020-12-31 NOTE — Progress Notes (Signed)
H/o non-insulin-dependent type 2 diabetes, hypertension, hypothyroidism presented to [place] ED due to [facility], report 4 episodes at home, had another 2 episodes while in the ER.  Vital signs stable , hemoglobin 14.4, CT abdomen was done in the ER due to report abdominal cramping showed mild acute diverticulitis, she is given 1 dose of IV Zosyn, EDP to contact [facility], accept to med telemetry /observation status.

## 2021-01-01 DIAGNOSIS — I700 Atherosclerosis of aorta: Secondary | ICD-10-CM | POA: Diagnosis present

## 2021-01-01 DIAGNOSIS — K5790 Diverticulosis of intestine, part unspecified, without perforation or abscess without bleeding: Secondary | ICD-10-CM | POA: Diagnosis not present

## 2021-01-01 DIAGNOSIS — D62 Acute posthemorrhagic anemia: Secondary | ICD-10-CM

## 2021-01-01 DIAGNOSIS — K625 Hemorrhage of anus and rectum: Secondary | ICD-10-CM | POA: Diagnosis present

## 2021-01-01 DIAGNOSIS — K5733 Diverticulitis of large intestine without perforation or abscess with bleeding: Secondary | ICD-10-CM | POA: Diagnosis present

## 2021-01-01 DIAGNOSIS — Z881 Allergy status to other antibiotic agents status: Secondary | ICD-10-CM | POA: Diagnosis not present

## 2021-01-01 DIAGNOSIS — K5792 Diverticulitis of intestine, part unspecified, without perforation or abscess without bleeding: Secondary | ICD-10-CM | POA: Diagnosis not present

## 2021-01-01 DIAGNOSIS — E785 Hyperlipidemia, unspecified: Secondary | ICD-10-CM | POA: Diagnosis present

## 2021-01-01 DIAGNOSIS — Z20822 Contact with and (suspected) exposure to covid-19: Secondary | ICD-10-CM | POA: Diagnosis present

## 2021-01-01 DIAGNOSIS — K922 Gastrointestinal hemorrhage, unspecified: Secondary | ICD-10-CM | POA: Diagnosis not present

## 2021-01-01 DIAGNOSIS — E039 Hypothyroidism, unspecified: Secondary | ICD-10-CM | POA: Diagnosis present

## 2021-01-01 DIAGNOSIS — Z79899 Other long term (current) drug therapy: Secondary | ICD-10-CM | POA: Diagnosis not present

## 2021-01-01 DIAGNOSIS — K219 Gastro-esophageal reflux disease without esophagitis: Secondary | ICD-10-CM | POA: Diagnosis present

## 2021-01-01 DIAGNOSIS — K648 Other hemorrhoids: Secondary | ICD-10-CM | POA: Diagnosis present

## 2021-01-01 DIAGNOSIS — Z885 Allergy status to narcotic agent status: Secondary | ICD-10-CM | POA: Diagnosis not present

## 2021-01-01 DIAGNOSIS — K921 Melena: Secondary | ICD-10-CM | POA: Diagnosis not present

## 2021-01-01 DIAGNOSIS — Z8371 Family history of colonic polyps: Secondary | ICD-10-CM | POA: Diagnosis not present

## 2021-01-01 DIAGNOSIS — F32A Depression, unspecified: Secondary | ICD-10-CM | POA: Diagnosis present

## 2021-01-01 DIAGNOSIS — Z801 Family history of malignant neoplasm of trachea, bronchus and lung: Secondary | ICD-10-CM | POA: Diagnosis not present

## 2021-01-01 DIAGNOSIS — E119 Type 2 diabetes mellitus without complications: Secondary | ICD-10-CM | POA: Diagnosis present

## 2021-01-01 DIAGNOSIS — D249 Benign neoplasm of unspecified breast: Secondary | ICD-10-CM | POA: Diagnosis present

## 2021-01-01 DIAGNOSIS — I10 Essential (primary) hypertension: Secondary | ICD-10-CM | POA: Diagnosis present

## 2021-01-01 DIAGNOSIS — Z79890 Hormone replacement therapy (postmenopausal): Secondary | ICD-10-CM | POA: Diagnosis not present

## 2021-01-01 DIAGNOSIS — K8020 Calculus of gallbladder without cholecystitis without obstruction: Secondary | ICD-10-CM | POA: Diagnosis present

## 2021-01-01 LAB — COMPREHENSIVE METABOLIC PANEL
ALT: 26 U/L (ref 0–44)
AST: 21 U/L (ref 15–41)
Albumin: 3.5 g/dL (ref 3.5–5.0)
Alkaline Phosphatase: 55 U/L (ref 38–126)
Anion gap: 7 (ref 5–15)
BUN: 11 mg/dL (ref 6–20)
CO2: 25 mmol/L (ref 22–32)
Calcium: 8.9 mg/dL (ref 8.9–10.3)
Chloride: 108 mmol/L (ref 98–111)
Creatinine, Ser: 0.72 mg/dL (ref 0.44–1.00)
GFR, Estimated: >60 mL/min (ref >60)
Glucose, Bld: 166 mg/dL — ABNORMAL HIGH (ref 70–99)
Potassium: 3.6 mmol/L (ref 3.5–5.1)
Sodium: 140 mmol/L (ref 135–145)
Total Bilirubin: 0.7 mg/dL (ref 0.3–1.2)
Total Protein: 6.2 g/dL — ABNORMAL LOW (ref 6.5–8.1)

## 2021-01-01 LAB — GLUCOSE, CAPILLARY
Glucose-Capillary: 169 mg/dL — ABNORMAL HIGH (ref 70–99)
Glucose-Capillary: 175 mg/dL — ABNORMAL HIGH (ref 70–99)
Glucose-Capillary: 164 mg/dL — ABNORMAL HIGH (ref 70–99)
Glucose-Capillary: 135 mg/dL — ABNORMAL HIGH (ref 70–99)

## 2021-01-01 LAB — HIV ANTIBODY (ROUTINE TESTING W REFLEX): HIV Screen 4th Generation wRfx: NONREACTIVE

## 2021-01-01 LAB — HEMOGLOBIN AND HEMATOCRIT, BLOOD
HCT: 34.3 % — ABNORMAL LOW (ref 36.0–46.0)
HCT: 35.7 % — ABNORMAL LOW (ref 36.0–46.0)
Hemoglobin: 11.5 g/dL — ABNORMAL LOW (ref 12.0–15.0)
Hemoglobin: 11.8 g/dL — ABNORMAL LOW (ref 12.0–15.0)

## 2021-01-01 LAB — HEMOGLOBIN: Hemoglobin: 11.4 g/dL — ABNORMAL LOW (ref 12.0–15.0)

## 2021-01-01 NOTE — Progress Notes (Signed)
PROGRESS NOTE    Kristen Conner  GUR:[id number] DOB: 08/26/1965 DOA: 12/31/2020 PCP: Ginger Organ., MD    Brief Narrative:   Kristen Conner was admitted to the hospital with a working diagnosis of acute diverticulitis, complicated with acute blood loss anemia  55 year old female with past medical history for diverticulosis, recent colonoscopy about 2 years ago who presented with worsening hematochezia.  Bleeding has been preceded by 3 days of colicky lower abdominal pain, no fevers, no chills, no nausea, vomiting or diarrhea.  On her initial physical examination, temperature 98.8, blood pressure 141/84, heart rate 85, respiratory rate 16, oxygen saturation 99% on room air.  Her lungs are clear to auscultation, heart S1-S2, present, rhythmic, soft abdomen, nontender to superficial palpation, no lower extremity edema.  Sodium 138, potassium 3.8, chloride 107, bicarb 22, glucose 154, BUN 15, creatinine 0.51, AST 23, ALT 33, white count 7.9, hemoglobin 14.4, hematocrit 41.1, platelets 322. SARS COVID-19 negative.  CT of the abdomen and pelvis with subtle changes of acute diverticulitis in the region of the splenic flexure of the colon.  No other diverticular abscess or signs of frank perforation.  Cholelithiasis without acute cholecystitis.  Aortic atherosclerosis  Assessment & Plan:   Principal Problem:   Diverticulitis Active Problems:   GI bleed   Diverticulosis   Hypothyroid   T2DM (type 2 diabetes mellitus) ([place])   Acute blood loss anemia   GI bleeding  1.  Acute diverticulitis with lower GI bleed, acute blood loss anemia.  Patient continue to have lower abdominal pain, colicky in nature. Tolerating po well with no nausea or vomiting. Continue to have bleeding per rectum, dark blood.   Continue supportive medical therapy with IV Zosyn, and close follow up on Hgb and Hct. Continue clear liquids for now. Continue hydration with isotonic saline at 75 ml per H.  Follow  Hgb and Hct this pm.   2.  Hypertension. Blood pressure stable, continue with metoprolol. Holding on losartan due to risk of hypotension.   3.  Type 2 diabetes mellitus/ dyslipidemia.  holding oral hypoglycemic agents, continue glucose control with insulin sliding scale.  Fasting glucose 166, capillary 169 and 175   Continue with rosuvastatin.   4.  Hypothyroidism.  On levothyroxine.  5.  Depression.  On citalopram and alprazolam    Patient continue to be at high risk for worsening colitis and bleeding.   Status is: Inpatient  Remains inpatient appropriate because:IV treatments appropriate due to intensity of illness or inability to take PO   Dispo: The patient is from: Home              Anticipated d/c is to: Home              Patient currently is not medically stable to d/c.   Difficult to place patient No   DVT prophylaxis: scd   Code Status:   full  Family Communication:  No family at the bedside      Consultants:   GI    Antimicrobials:   Zosyn     Subjective: Patient continue to have lower abdominal pain, no nausea or vomiting. Positive dark blood per rectum. No chest pain or dyspnea.   Objective: Vitals:   12/31/20 1706 12/31/20 2130 01/01/21 0108 01/01/21 0541  BP: (!) 141/84 123/77 106/66 122/71  Pulse: 85 82 80 80  Resp: 16 (!) 21 (!) 21 18  Temp: 98.8 F (37.1 C) 98.6 F (37 C) 98.2 F (36.8 C) 98.1 F (  36.7 C)  TempSrc: Oral Oral  Oral  SpO2: 99% 99% 98% 99%  Weight:      Height:        Intake/Output Summary (Last 24 hours) at 01/01/2021 1344 Last data filed at 01/01/2021 0306 Gross per 24 hour  Intake 1430.98 ml  Output --  Net 1430.98 ml   Filed Weights   12/31/20 1218  Weight: 87.1 kg    Examination:   General: Not in pain or dyspnea, deconditioned  Neurology: Awake and alert, non focal  E ENT: mild pallor, no icterus, oral mucosa moist Cardiovascular: No JVD. S1-S2 present, rhythmic, no gallops, rubs, or murmurs. No  lower extremity edema. Pulmonary: positive breath sounds bilaterally, adequate air movement, no wheezing, rhonchi or rales. Gastrointestinal. Abdomen soft and non tender to superficial palpation, mild distended Skin. No rashes Musculoskeletal: no joint deformities     Data Reviewed: I have personally reviewed following labs and imaging studies  CBC: Recent Labs  Lab 12/31/20 1320 12/31/20 1715 01/01/21 0642  WBC 7.9  --   --   NEUTROABS 4.7  --   --   HGB 14.4 13.6 11.4*  HCT 41.1  --   --   MCV 88.4  --   --   PLT 322  --   --    Basic Metabolic Panel: Recent Labs  Lab 12/31/20 1320 01/01/21 0642  NA 138 140  K 3.8 3.6  CL 107 108  CO2 22 25  GLUCOSE 154* 166*  BUN 15 11  CREATININE 0.51 0.72  CALCIUM 9.2 8.9   GFR: Estimated Creatinine Clearance: 83.9 mL/min (by C-G formula based on SCr of 0.72 mg/dL). Liver Function Tests: Recent Labs  Lab 12/31/20 1320 01/01/21 0642  AST 23 21  ALT 33 26  ALKPHOS 75 55  BILITOT 0.4 0.7  PROT 7.2 6.2*  ALBUMIN 3.9 3.5   Recent Labs  Lab 12/31/20 1320  LIPASE 32   No results for input(s): AMMONIA in the last 168 hours. Coagulation Profile: No results for input(s): INR, PROTIME in the last 168 hours. Cardiac Enzymes: No results for input(s): CKTOTAL, CKMB, CKMBINDEX, TROPONINI in the last 168 hours. BNP (last 3 results) No results for input(s): PROBNP in the last 8760 hours. HbA1C: No results for input(s): HGBA1C in the last 72 hours. CBG: Recent Labs  Lab 12/31/20 2028 01/01/21 0720 01/01/21 1122  GLUCAP 209* 169* 175*   Lipid Profile: No results for input(s): CHOL, HDL, LDLCALC, TRIG, CHOLHDL, LDLDIRECT in the last 72 hours. Thyroid Function Tests: No results for input(s): TSH, T4TOTAL, FREET4, T3FREE, THYROIDAB in the last 72 hours. Anemia Panel: No results for input(s): VITAMINB12, FOLATE, FERRITIN, TIBC, IRON, RETICCTPCT in the last 72 hours.    Radiology Studies: I have reviewed all of the  imaging during this hospital visit personally     Scheduled Meds: . citalopram  40 mg Oral Daily  . insulin aspart  0-15 Units Subcutaneous TID WC  . levothyroxine  100 mcg Oral Q0600  . loratadine  10 mg Oral Daily  . metoprolol succinate  25 mg Oral Daily  . rosuvastatin  10 mg Oral Daily   Continuous Infusions: . sodium chloride 75 mL/hr at 01/01/21 0617  . piperacillin-tazobactam (ZOSYN)  IV 3.375 g (01/01/21 1306)     LOS: 0 days        Miski Feldpausch Gerome Apley, MD

## 2021-01-01 NOTE — Progress Notes (Signed)
Patient complaining of a slight headache. Patient denies any lightheadedness or dizziness. No nausea. Patient states she is fine, does not want any PRN medication. Will continue to monitor.

## 2021-01-02 DIAGNOSIS — K922 Gastrointestinal hemorrhage, unspecified: Secondary | ICD-10-CM | POA: Diagnosis not present

## 2021-01-02 DIAGNOSIS — D62 Acute posthemorrhagic anemia: Secondary | ICD-10-CM | POA: Diagnosis not present

## 2021-01-02 DIAGNOSIS — K5792 Diverticulitis of intestine, part unspecified, without perforation or abscess without bleeding: Secondary | ICD-10-CM | POA: Diagnosis not present

## 2021-01-02 DIAGNOSIS — K5790 Diverticulosis of intestine, part unspecified, without perforation or abscess without bleeding: Secondary | ICD-10-CM | POA: Diagnosis not present

## 2021-01-02 LAB — BASIC METABOLIC PANEL
Anion gap: 6 (ref 5–15)
BUN: 9 mg/dL (ref 6–20)
CO2: 25 mmol/L (ref 22–32)
Calcium: 8.8 mg/dL — ABNORMAL LOW (ref 8.9–10.3)
Chloride: 108 mmol/L (ref 98–111)
Creatinine, Ser: 0.57 mg/dL (ref 0.44–1.00)
GFR, Estimated: >60 mL/min (ref >60)
Glucose, Bld: 141 mg/dL — ABNORMAL HIGH (ref 70–99)
Potassium: 3.4 mmol/L — ABNORMAL LOW (ref 3.5–5.1)
Sodium: 139 mmol/L (ref 135–145)

## 2021-01-02 LAB — CBC
HCT: 32.9 % — ABNORMAL LOW (ref 36.0–46.0)
Hemoglobin: 10.8 g/dL — ABNORMAL LOW (ref 12.0–15.0)
MCH: 30.2 pg (ref 26.0–34.0)
MCHC: 32.8 g/dL (ref 30.0–36.0)
MCV: 91.9 fL (ref 80.0–100.0)
Platelets: 249 10*3/uL (ref 150–400)
RBC: 3.58 MIL/uL — ABNORMAL LOW (ref 3.87–5.11)
RDW: 12 % (ref 11.5–15.5)
WBC: 6.5 10*3/uL (ref 4.0–10.5)
nRBC: 0 % (ref 0.0–0.2)

## 2021-01-02 LAB — HEMOGLOBIN AND HEMATOCRIT, BLOOD
HCT: 34 % — ABNORMAL LOW (ref 36.0–46.0)
Hemoglobin: 10.8 g/dL — ABNORMAL LOW (ref 12.0–15.0)

## 2021-01-02 LAB — HEMOGLOBIN A1C
Hgb A1c MFr Bld: 7.3 % — ABNORMAL HIGH (ref 4.8–5.6)
Mean Plasma Glucose: 163 mg/dL

## 2021-01-02 LAB — GLUCOSE, CAPILLARY
Glucose-Capillary: 139 mg/dL — ABNORMAL HIGH (ref 70–99)
Glucose-Capillary: 240 mg/dL — ABNORMAL HIGH (ref 70–99)
Glucose-Capillary: 148 mg/dL — ABNORMAL HIGH (ref 70–99)
Glucose-Capillary: 220 mg/dL — ABNORMAL HIGH (ref 70–99)

## 2021-01-02 MED ORDER — POTASSIUM CHLORIDE CRYS ER 10 MEQ PO TBCR
40.0000 meq | EXTENDED_RELEASE_TABLET | Freq: Once | ORAL | Status: AC
  Administered 2021-01-02: 40 meq via ORAL
  Filled 2021-01-02: qty 4

## 2021-01-02 NOTE — Progress Notes (Signed)
PROGRESS NOTE    Kristen Conner  VZS:[id number] DOB: 1966/01/27 DOA: 12/31/2020 PCP: Ginger Organ., MD    Brief Narrative:  Kristen Conner was admitted to the hospital with a working diagnosis of acute diverticulitis, complicated with acute blood loss anemia  55 year old female with past medical history for diverticulosis, recent colonoscopy about 2 years ago who presented with worsening hematochezia. Bleeding has been preceded by 3 days of colicky lower abdominal pain, no fevers, no chills, no nausea, vomiting or diarrhea. On her initial physical examination, temperature 98.8, blood pressure 141/84, heart rate 85, respiratory rate 16, oxygen saturation 99% on room air. Her lungs are clear to auscultation, heart S1-S2, present, rhythmic, soft abdomen, nontender to superficial palpation, no lower extremity edema.  Sodium 138, potassium 3.8, chloride 107, bicarb 22, glucose 154, BUN 15, creatinine 0.51, AST 23, ALT 33, white count 7.9, hemoglobin 14.4, hematocrit 41.1, platelets 322. SARS COVID-19 negative.  CT of the abdomen and pelvis with subtle changes of acute diverticulitis in the region of the splenic flexure of the colon. No other diverticular abscess or signs of frankperforation. Cholelithiasis without acute cholecystitis. Aortic atherosclerosis  Admitted to the telemetry ward, placed on IV antibiotic therapy and as needed analgesics. Continue to have hematochezia and slowly trending down Hgb and Hct.    Assessment & Plan:   Principal Problem:   Diverticulitis Active Problems:   GI bleed   Diverticulosis   Hypothyroid   T2DM (type 2 diabetes mellitus) ([place])   Acute blood loss anemia   GI bleeding   1.Acute diverticulitis with lower GI bleed, acute blood loss anemia. Improving lower abdominal pain, this am had hematochezia but this pm with more brown stool. No blood clots.   Hgb has dropped to 10, 8 from 14.,4 on admission.   Plan to continue  with IV Zosyn (#2)  for antibiotic therapy.  Hold on PRBC transfusion for now, unless rapid drop on Hgb or Hgb less than 7. If brisk bleeding may need Ct angiography. If no further bleeding, plan to advance diet   2.Hypertension.Continue blood pressure control with metoprolol. Continue gentle hydration with isotonic saline.   3.Type 2 diabetes mellitus/ dyslipidemia.glucose continue to be stable, this am at 166 mg/dl.  Currently on clear liquid diet. Continue glucose cover and monitoring with insulin sliding scale, hold on oral hypoglycemic agents.   Continue with rosuvastatin.   4.Hypothyroidism.Continue with levothyroxine.  5.Depression.Continue with citalopram and alprazolam   Patient continue to be at high risk for worsening bleeding   Status is: Inpatient  Remains inpatient appropriate because:IV treatments appropriate due to intensity of illness or inability to take PO   Dispo: The patient is from: Home              Anticipated d/c is to: Home              Patient currently is not medically stable to d/c.   Difficult to place patient No   DVT prophylaxis: scd  Code Status:    full  Family Communication:  No family at the bedside       Consultants:   GI    Subjective: Patient is tolerating clears well, no nausea or vomiting, positive colicky lower abdominal pain, improved with bowel movements. Had hematochezia this am but not this pm.   Objective: Vitals:   01/01/21 1440 01/01/21 2029 01/02/21 0459 01/02/21 1014  BP: 122/74 125/70 107/68 122/73  Pulse: 89 85 78 82  Resp: (!) 22 18 19  Temp:  98.2 F (36.8 C) 97.9 F (36.6 C)   TempSrc:  Oral Oral   SpO2: 97% 98% 100%   Weight:      Height:        Intake/Output Summary (Last 24 hours) at 01/02/2021 1357 Last data filed at 01/02/2021 0600 Gross per 24 hour  Intake 480 ml  Output --  Net 480 ml   Filed Weights   12/31/20 1218  Weight: 87.1 kg    Examination:   General: Not  in pain or dyspnea, deconditioned  Neurology: Awake and alert, non focal  E ENT: mild pallor, no icterus, oral mucosa moist Cardiovascular: No JVD. S1-S2 present, rhythmic, no gallops, rubs, or murmurs. No lower extremity edema. Pulmonary: positive breath sounds bilaterally, adequate air movement, no wheezing, rhonchi or rales. Gastrointestinal. Abdomen soft and non tender Skin. No rashes Musculoskeletal: no joint deformities     Data Reviewed: I have personally reviewed following labs and imaging studies  CBC: Recent Labs  Lab 12/31/20 1320 12/31/20 1715 01/01/21 0642 01/01/21 1350 01/01/21 1930 01/02/21 0448  WBC 7.9  --   --   --   --  6.5  NEUTROABS 4.7  --   --   --   --   --   HGB 14.4 13.6 11.4* 11.5* 11.8* 10.8*  HCT 41.1  --   --  34.3* 35.7* 32.9*  MCV 88.4  --   --   --   --  91.9  PLT 322  --   --   --   --  160   Basic Metabolic Panel: Recent Labs  Lab 12/31/20 1320 01/01/21 0642 01/02/21 0448  NA 138 140 139  K 3.8 3.6 3.4*  CL 107 108 108  CO2 22 25 25   GLUCOSE 154* 166* 141*  BUN 15 11 9   CREATININE 0.51 0.72 0.57  CALCIUM 9.2 8.9 8.8*   GFR: Estimated Creatinine Clearance: 83.9 mL/min (by C-G formula based on SCr of 0.57 mg/dL). Liver Function Tests: Recent Labs  Lab 12/31/20 1320 01/01/21 0642  AST 23 21  ALT 33 26  ALKPHOS 75 55  BILITOT 0.4 0.7  PROT 7.2 6.2*  ALBUMIN 3.9 3.5   Recent Labs  Lab 12/31/20 1320  LIPASE 32   No results for input(s): AMMONIA in the last 168 hours. Coagulation Profile: No results for input(s): INR, PROTIME in the last 168 hours. Cardiac Enzymes: No results for input(s): CKTOTAL, CKMB, CKMBINDEX, TROPONINI in the last 168 hours. BNP (last 3 results) No results for input(s): PROBNP in the last 8760 hours. HbA1C: Recent Labs    01/01/21 0043  HGBA1C 7.3*   CBG: Recent Labs  Lab 01/01/21 1122 01/01/21 1653 01/01/21 2059 01/02/21 0756 01/02/21 1140  GLUCAP 175* 164* 135* 139* 240*   Lipid  Profile: No results for input(s): CHOL, HDL, LDLCALC, TRIG, CHOLHDL, LDLDIRECT in the last 72 hours. Thyroid Function Tests: No results for input(s): TSH, T4TOTAL, FREET4, T3FREE, THYROIDAB in the last 72 hours. Anemia Panel: No results for input(s): VITAMINB12, FOLATE, FERRITIN, TIBC, IRON, RETICCTPCT in the last 72 hours.    Radiology Studies: I have reviewed all of the imaging during this hospital visit personally     Scheduled Meds: . citalopram  40 mg Oral Daily  . insulin aspart  0-15 Units Subcutaneous TID WC  . levothyroxine  100 mcg Oral Q0600  . loratadine  10 mg Oral Daily  . metoprolol succinate  25 mg Oral Daily  . rosuvastatin  10 mg Oral Daily   Continuous Infusions: . sodium chloride 75 mL/hr at 01/02/21 0435  . piperacillin-tazobactam (ZOSYN)  IV 3.375 g (01/02/21 1224)     LOS: 1 day        Charolette Bultman Gerome Apley, MD

## 2021-01-02 NOTE — Progress Notes (Addendum)
Attending physician's note   I have taken an interval history, reviewed the chart and examined the patient. I agree with the Advanced Practitioner's note, impression, and recommendations as outlined.   Repeat Hgb this afternoon stable at 10.8.  No further bleeding.  Asking to advance diet.  Sounds like she probably had an episode of diverticulitis approximately 12 years ago, treated with antibiotics during a 6-day hospital stay.  No similar episodes in the interim.  Given current clinical stability, continue conservative measures for now.  If no issues overnight, plan to convert to oral antibiotics tomorrow with plan for outpatient colonoscopy in 8+ weeks.  [place], DO, FACG [phone] office             [facility] Progress Note  CC:  Hematochezia   Subjective: She reported passing bright red blood per the rectum 6 to 7 times yesterday, less volume. Today she passed a small amount of bright red blood per the rectum which occurred approximately 30 minutes after she ate a clear liquid breakfast. She is having some lower abdominal cramping, more to the LLQ without significant pain. No N/V. No other complaints at this time. 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  Objective:   Abd/pelvic CT WO contrast 12/31/2020: 1. Subtle changes of acute diverticulitis in the region of the splenic flexure of the colon. No diverticular abscess or signs of frank perforation are noted at this time. 2. Cholelithiasis without evidence of acute cholecystitis. 3. Aortic atherosclerosis. 4. Additional incidental findings, as above.   Vital signs in last 24 hours: Temp:  [97.9 F (36.6 C)-98.2 F (36.8 C)] 97.9 F (36.6 C) (05/31 0459) Pulse Rate:  [78-89] 78 (05/31 0459) Resp:  [18-22] 19 (05/31 0459) BP: (107-125)/(68-74) 107/68 (05/31 0459) SpO2:  [97 %-100 %] 100 % (05/31 0459) Last BM Date:  01/01/21 General:   Alert 55 year old female in NAD.  Heart: RRR, no murmur.  Pulm:  Breath sounds clear throughout.  Abdomen: Soft, nontender, nondistended. + BS x 4 quads.  Extremities:  Without edema. Neurologic:  Alert and  oriented x 4. Grossly normal neurologically. Psych:  Alert and cooperative. Normal mood and affect.  Intake/Output from previous day: 05/30 0701 - 05/31 0700 In: 480 [P.O.:480] Out: -  Intake/Output this shift: No intake/output data recorded.  Lab Results: Recent Labs    12/31/20 1320 12/31/20 1715 01/01/21 1350 01/01/21 1930 01/02/21 0448  WBC 7.9  --   --   --  6.5  HGB 14.4   < > 11.5* 11.8* 10.8*  HCT 41.1  --  34.3* 35.7* 32.9*  PLT 322  --   --   --  249   < > = values in this interval not displayed.   BMET Recent Labs    12/31/20 1320 01/01/21 0642 01/02/21 0448  NA 138 140 139  K 3.8 3.6 3.4*  CL 107 108 108  CO2 22 25 25   GLUCOSE 154* 166* 141*  BUN 15 11 9   CREATININE 0.51 0.72 0.57  CALCIUM 9.2 8.9 8.8*   LFT Recent Labs    01/01/21 0642  PROT 6.2*  ALBUMIN 3.5  AST 21  ALT 26  ALKPHOS 55  BILITOT 0.7   PT/INR No results for input(s): LABPROT, INR in the last 72 hours. Hepatitis Panel No results for input(s): HEPBSAG, HCVAB, HEPAIGM, HEPBIGM in the last 72 hours.  CT ABDOMEN PELVIS WO CONTRAST  Result Date: 12/31/2020 CLINICAL DATA:  55 year old female with history of blood in stool today. Suspected diverticulitis. EXAM: CT ABDOMEN AND PELVIS WITHOUT CONTRAST TECHNIQUE: Multidetector CT imaging of the abdomen and pelvis was performed following the standard protocol without IV contrast. COMPARISON:  CT the abdomen and pelvis 12/30/2011. FINDINGS: Lower chest: Unremarkable. Hepatobiliary: No definite suspicious cystic or solid hepatic lesions are confidently identified on today's noncontrast CT examination. Tiny calcified gallstone lying dependently in the gallbladder. No findings to suggest an acute cholecystitis at this  time. Pancreas: No definite pancreatic mass or peripancreatic fluid collections or inflammatory changes are noted on today's noncontrast CT examination. Spleen: Unremarkable. Adrenals/Urinary Tract: There are no abnormal calcifications within the collecting system of either kidney, along the course of either ureter, or within the lumen of the urinary bladder. Exophytic 1.7 cm low-attenuation lesion in the upper pole of the left kidney, incompletely characterized on today's non-contrast CT examination, but favored to represent a small cyst. Right kidney and bilateral adrenal glands are otherwise normal in appearance. Urinary bladder is normal in appearance. Bilateral adrenal glands are normal in appearance. Stomach/Bowel: Unenhanced appearance of the stomach is normal. No pathologic dilatation of small bowel or colon. Numerous colonic diverticulae are noted. In the region of the splenic flexure (axial image 19 of series 2) there are subtle inflammatory changes adjacent to a diverticulum. No discrete diverticular abscess or frank perforation. Normal appendix. Vascular/Lymphatic: Aortic atherosclerosis. No lymphadenopathy noted in the abdomen or pelvis. Reproductive: Uterus and ovaries are are unremarkable in appearance. Other: No significant volume of ascites noted in the visualized portions of the peritoneal cavity. Musculoskeletal: There are no aggressive appearing lytic or blastic lesions noted in the visualized portions of the skeleton. IMPRESSION: 1. Subtle changes of acute diverticulitis in the region of the splenic flexure of the colon. No diverticular abscess or signs of frank perforation are noted at this time. 2. Cholelithiasis without evidence of acute cholecystitis. 3. Aortic atherosclerosis. 4. Additional incidental findings, as above. Electronically Signed   By: Vinnie Langton M.D.   On: 12/31/2020 13:52    Assessment / Plan:  7. 55 year old female with hematochezia with LLQ abdominal pain. No  prior history of GI bleed. CTAP without contrast 6/76/7209 showed uncomplicated diverticulitis at the splenic flexure. Admission Hg 14.4 -> 13.6 -> Hg 11.4 -> 11.5 -> 11.8 -> 10.8. She passed a small volume of bright red blood at 7am. Some abdominal cramping, no significant abdominal pain. She is on Zosyn for diverticulitis, however, hematochezia does not typically occur with acute diverticulitis.  -Monitor H/H closely. Repeat H/H at 12pm. -Abd/pelvic CTA if brisk GI bleeding occurs  -Conservative management -Continue clear liquid diet for now  -Continue IV fluids -Eventual colonoscopy, most likely as an outpatient if her lower GI bleeding abates. However, if brisk lower GI bleeding occurs she may require a colonoscopy during this admission. Further recommendations per Dr. Bryan Lemma   Principal Problem:   Diverticulitis Active Problems:   GI bleed   Diverticulosis   Hypothyroid   T2DM (type 2 diabetes mellitus) ([place])   Acute blood loss anemia   GI bleeding     LOS: 1 day   Noralyn Pick  01/02/2021, 9:44 AM

## 2021-01-03 DIAGNOSIS — D62 Acute posthemorrhagic anemia: Secondary | ICD-10-CM | POA: Diagnosis not present

## 2021-01-03 DIAGNOSIS — K219 Gastro-esophageal reflux disease without esophagitis: Secondary | ICD-10-CM | POA: Diagnosis not present

## 2021-01-03 DIAGNOSIS — K5792 Diverticulitis of intestine, part unspecified, without perforation or abscess without bleeding: Secondary | ICD-10-CM | POA: Diagnosis not present

## 2021-01-03 DIAGNOSIS — K5793 Diverticulitis of intestine, part unspecified, without perforation or abscess with bleeding: Secondary | ICD-10-CM

## 2021-01-03 DIAGNOSIS — K5790 Diverticulosis of intestine, part unspecified, without perforation or abscess without bleeding: Secondary | ICD-10-CM | POA: Diagnosis not present

## 2021-01-03 LAB — BASIC METABOLIC PANEL
Anion gap: 4 — ABNORMAL LOW (ref 5–15)
BUN: 8 mg/dL (ref 6–20)
CO2: 30 mmol/L (ref 22–32)
Calcium: 9.3 mg/dL (ref 8.9–10.3)
Chloride: 108 mmol/L (ref 98–111)
Creatinine, Ser: 0.69 mg/dL (ref 0.44–1.00)
GFR, Estimated: >60 mL/min (ref >60)
Glucose, Bld: 159 mg/dL — ABNORMAL HIGH (ref 70–99)
Potassium: 4.3 mmol/L (ref 3.5–5.1)
Sodium: 142 mmol/L (ref 135–145)

## 2021-01-03 LAB — GLUCOSE, CAPILLARY
Glucose-Capillary: 177 mg/dL — ABNORMAL HIGH (ref 70–99)
Glucose-Capillary: 187 mg/dL — ABNORMAL HIGH (ref 70–99)
Glucose-Capillary: 189 mg/dL — ABNORMAL HIGH (ref 70–99)
Glucose-Capillary: 212 mg/dL — ABNORMAL HIGH (ref 70–99)

## 2021-01-03 LAB — CBC WITH DIFFERENTIAL/PLATELET
Abs Immature Granulocytes: 0.04 10*3/uL (ref 0.00–0.07)
Basophils Absolute: 0.1 10*3/uL (ref 0.0–0.1)
Basophils Relative: 1 %
Eosinophils Absolute: 0.2 10*3/uL (ref 0.0–0.5)
Eosinophils Relative: 2 %
HCT: 32.3 % — ABNORMAL LOW (ref 36.0–46.0)
Hemoglobin: 10.7 g/dL — ABNORMAL LOW (ref 12.0–15.0)
Immature Granulocytes: 1 %
Lymphocytes Relative: 32 %
Lymphs Abs: 2.3 10*3/uL (ref 0.7–4.0)
MCH: 30.3 pg (ref 26.0–34.0)
MCHC: 33.1 g/dL (ref 30.0–36.0)
MCV: 91.5 fL (ref 80.0–100.0)
Monocytes Absolute: 0.4 10*3/uL (ref 0.1–1.0)
Monocytes Relative: 5 %
Neutro Abs: 4.3 10*3/uL (ref 1.7–7.7)
Neutrophils Relative %: 59 %
Platelets: 250 10*3/uL (ref 150–400)
RBC: 3.53 MIL/uL — ABNORMAL LOW (ref 3.87–5.11)
RDW: 12.1 % (ref 11.5–15.5)
WBC: 7.4 10*3/uL (ref 4.0–10.5)
nRBC: 0 % (ref 0.0–0.2)

## 2021-01-03 MED ORDER — CIPROFLOXACIN HCL 500 MG PO TABS
500.0000 mg | ORAL_TABLET | Freq: Two times a day (BID) | ORAL | Status: DC
  Administered 2021-01-03 – 2021-01-04 (×3): 500 mg via ORAL
  Filled 2021-01-03 (×3): qty 1

## 2021-01-03 MED ORDER — METRONIDAZOLE 500 MG PO TABS
500.0000 mg | ORAL_TABLET | Freq: Two times a day (BID) | ORAL | Status: DC
  Administered 2021-01-03 – 2021-01-04 (×3): 500 mg via ORAL
  Filled 2021-01-03 (×3): qty 1

## 2021-01-03 MED ORDER — PANTOPRAZOLE SODIUM 40 MG PO TBEC
40.0000 mg | DELAYED_RELEASE_TABLET | Freq: Every day | ORAL | Status: DC
  Administered 2021-01-03 – 2021-01-04 (×2): 40 mg via ORAL
  Filled 2021-01-03 (×2): qty 1

## 2021-01-03 NOTE — Progress Notes (Addendum)
Attending physician's note   I have taken an interval history, reviewed the chart and examined the patient. I agree with the Advanced Practitioner's note, impression, and recommendations as outlined.   Feeling better today and tolerating full lunch without any issue.  Hemoglobin stable at 10.7 and no further overt bleeding.  Transitioned to oral ABX earlier today without issue.    Does report a longstanding history of GERD for many years.  Had been well controlled with Nexium daily, but stopped taking when literature was released re: possible dementia ADR.  Essentially has daily reflux symptoms now.    - Continue ABX as outlined - Repeat CBC 7-10 days after hospital discharge - Already has follow-up appointment in the GI clinic - Plan for colonoscopy in 8+ weeks - Resume Protonix 40 mg/day as outpatient - Plan for EGD to evaluate for erosive esophagitis, Barrett's esophagus screening as an outpatient.  This can be coordinated to be done at the same time as colonoscopy - Anticipate discharge later today or tomorrow morning.  GI service will sign off at this time.  Please do not hesitate to contact with additional questions or concerns.  Ruskin, DO, FACG [phone] office             [facility] Progress Note  CC:   Hematochezia   Subjective: She feels well this morning. Yesterday am she passed a small amount of red blood per the rectum x 2 in the am then bits of brown stool x 3 without any further blood per the rectum yesterday afternoon/everning. No further blood per the rectum. No BM yet this am. She had mild lower abdominal cramping after eating clears then a full liquid diet yesterday. She complains of having daily heartburn for many years. No other complaints today.    Objective:  Vital signs in last 24 hours: Temp:  [97.6 F (36.4 C)-97.8 F (36.6 C)] 97.8 F (36.6 C) (06/01 0507) Pulse Rate:  [82-90] 83 (06/01 0507) Resp:  [15-22] 22 (06/01  0507) BP: (117-128)/(70-81) 117/76 (06/01 0507) SpO2:  [97 %-100 %] 97 % (06/01 0507) Last BM Date: 01/02/21   General:   Alert in NAD. Heart: RRR, no murmur.  Pulm:  Breath sounds clear throughout.  Abdomen: Soft, nondistended. Nontender. Positive BS x 4 quads.  Extremities:  Without edema. Neurologic:  Alert and  oriented x4. Grossly normal neurologically. Psych:  Alert and cooperative. Normal mood and affect.  Intake/Output from previous day: 05/31 0701 - 06/01 0700 In: 480 [P.O.:480] Out: -  Intake/Output this shift: No intake/output data recorded.  Lab Results: Recent Labs    12/31/20 1320 12/31/20 1715 01/02/21 0448 01/02/21 1348 01/03/21 0458  WBC 7.9  --  6.5  --  7.4  HGB 14.4   < > 10.8* 10.8* 10.7*  HCT 41.1   < > 32.9* 34.0* 32.3*  PLT 322  --  249  --  250   < > = values in this interval not displayed.   BMET Recent Labs    01/01/21 0642 01/02/21 0448 01/03/21 0458  NA 140 139 142  K 3.6 3.4* 4.3  CL 108 108 108  CO2 25 25 30   GLUCOSE 166* 141* 159*  BUN 11 9 8   CREATININE 0.72 0.57 0.69  CALCIUM 8.9 8.8* 9.3   LFT Recent Labs    01/01/21 0642  PROT 6.2*  ALBUMIN 3.5  AST 21  ALT 26  ALKPHOS 55  BILITOT 0.7   PT/INR No results for  input(s): LABPROT, INR in the last 72 hours. Hepatitis Panel No results for input(s): HEPBSAG, HCVAB, HEPAIGM, HEPBIGM in the last 72 hours.  No results found.  Assessment / Plan:  53. 55 year old female with hematochezia with LLQ abdominal cramping. No prior history of GI bleed. CTAP without contrast 5/00/3704 showed uncomplicated diverticulitis at the splenic flexure. Admission Hg 14.4 -> 13.6 -> Hg 11.4 -> 11.5 -> 11.8 -> 10.8. Today Hg 10.7.  She last passed a small volume of bright red blood per the rectum x 2 yesterday am. No further hematochezia since then.  Mild lower abdominal cramping after eating a clear/full liquid diet. No significant abdominal pain.  -Advance to soft diet -Stop Zosyn -Cipro  500mg  one tab po bid x 5 days and Metronidazole 500mg  one tab po bid x 5 days -Ok to discharge home from GI standpoint if no active GI bleeding this am -CBC in one week, our office will provide lab order  -Follow up in our GI clinic at 10 AM on Wed January 31, 2021 with Kristen Conner CRNP  -Colonoscopy in 8 weeks   2. GERD.  -Pantoprazole 40mg  po QD -Further evaluation/management to be discussed at time of outpatient follow up appointment as scheduled above. Plan on scheduling EGD at time of colonoscopy       Principal Problem:   Diverticulitis Active Problems:   GI bleed   Diverticulosis   Hypothyroid   T2DM (type 2 diabetes mellitus) ([place])   Acute blood loss anemia   GI bleeding     LOS: 2 days   Kristen Conner  01/03/2021, 8:54AM

## 2021-01-03 NOTE — Plan of Care (Signed)
  Problem: Nutrition: Goal: Adequate nutrition will be maintained Outcome: Progressing   Problem: Coping: Goal: Level of anxiety will decrease Outcome: Progressing   

## 2021-01-03 NOTE — Progress Notes (Signed)
PROGRESS NOTE    Kristen Conner  GQB:[id number] DOB: August 31, 1965 DOA: 12/31/2020 PCP: Kristen Organ., MD    Brief Narrative:  [name] to the hospital with a working diagnosis of acute diverticulitis, complicated with acute blood loss anemia  55 year old female with past medical history for diverticulosis, recent colonoscopy about 2 years ago who presented with worsening hematochezia. Bleeding has been preceded by 3 days of colicky lower abdominal pain, no fevers, no chills, no nausea, vomiting or diarrhea. On her initial physical examination, temperature 98.8, blood pressure 141/84, heart rate 85, respiratory rate 16, oxygen saturation 99% on room air. Her lungs are clear to auscultation, heart S1-S2, present, rhythmic, soft abdomen, nontender to superficial palpation, no lower extremity edema.  Sodium 138, potassium 3.8, chloride 107, bicarb 22, glucose 154, BUN 15, creatinine 0.51, AST 23, ALT 33, white count 7.9, hemoglobin 14.4, hematocrit 41.1, platelets 322. SARS COVID-19 negative.  CT of the abdomen and pelvis with subtle changes of acute diverticulitis in the region of the splenic flexure of the colon. No other diverticular abscess or signs of frankperforation. Cholelithiasis without acute cholecystitis. Aortic atherosclerosis  Admitted to the telemetry ward, placed on IV antibiotic therapy and as needed analgesics. Initially she had continuous hematochezia and slowly trending down Hgb and Hct.  Cell count has been stable and no further hematochezia.    Assessment & Plan:   Principal Problem:   Diverticulitis Active Problems:   GI bleed   Diverticulosis   Hypothyroid   T2DM (type 2 diabetes mellitus) ([place])   Acute blood loss anemia   GI bleeding    1.Acute diverticulitis with lower GI bleed, acute blood loss anemia. Continue to have intermittent colicky lower abdominal pain.  Stool with no further blood, cell count continue  to be stable at 10.7.  Antibiotic therapy has been transition to ciprofloxacin and metronidazole.  Plan to advance diet to softtoday, if good toleration discharge home tomorrow with outpatient follow up with GI.  May need outpatient colonoscopy.   Discontinue telemetry, and encourage to be out of bed to chair tid with meals.   2.Hypertension.Blood pressure continue to be stable today, continue to hold on antihypertensive medications. Discontinue IV fluids.   3.Type 2 diabetes mellitus/ dyslipidemia.fasting glucose is 159, continue with insulin sliding scale for glucose cover and monitoring Advance diet to soft today.   On rosuvastatin.   4.Hypothyroidism.Onlevothyroxine.  5.Depression.On citalopram and alprazolam  Status is: Inpatient  Remains inpatient appropriate because:Inpatient level of care appropriate due to severity of illness   Dispo: The patient is from: Home              Anticipated d/c is to: Home              Patient currently is not medically stable to d/c. Plan for possible dc home in am, if continue to improve and no more bleeding.    Difficult to place patient No   DVT prophylaxis: scd  Code Status:    full  Family Communication:  No family at the bedside      Consultants:   GI   Antimicrobials:   Zosyn  Now on ciprofloxacin and metronidazole     Subjective: Patient with no further hematochezia, no nausea or vomiting, no dyspnea or chest pain. Continue to have colicky lower abdominal pain, intermittent mild to moderate in intensity.   Objective: Vitals:   01/02/21 2054 01/03/21 0507 01/03/21 1025 01/03/21 1351  BP: 125/70 117/76 114/73 122/66  Pulse: 90  83 91 89  Resp: 15 (!) 22  18  Temp: 97.6 F (36.4 C) 97.8 F (36.6 C)  98.2 F (36.8 C)  TempSrc: Oral Oral  Oral  SpO2: 100% 97%  99%  Weight:      Height:        Intake/Output Summary (Last 24 hours) at 01/03/2021 1422 Last data filed at 01/03/2021 0600 Gross  per 24 hour  Intake 480 ml  Output --  Net 480 ml   Filed Weights   12/31/20 1218  Weight: 87.1 kg    Examination:   General: Not in pain or dyspnea, deconditioned  Neurology: Awake and alert, non focal  E ENT: mild pallor, no icterus, oral mucosa moist Cardiovascular: No JVD. S1-S2 present, rhythmic, no gallops, rubs, or murmurs. No lower extremity edema. Pulmonary: positive breath sounds bilaterally, adequate air movement, no wheezing, rhonchi or rales. Gastrointestinal. Abdomen soft and non tender to superficial palpation Skin. No rashes Musculoskeletal: no joint deformities     Data Reviewed: I have personally reviewed following labs and imaging studies  CBC: Recent Labs  Lab 12/31/20 1320 12/31/20 1715 01/01/21 1350 01/01/21 1930 01/02/21 0448 01/02/21 1348 01/03/21 0458  WBC 7.9  --   --   --  6.5  --  7.4  NEUTROABS 4.7  --   --   --   --   --  4.3  HGB 14.4   < > 11.5* 11.8* 10.8* 10.8* 10.7*  HCT 41.1  --  34.3* 35.7* 32.9* 34.0* 32.3*  MCV 88.4  --   --   --  91.9  --  91.5  PLT 322  --   --   --  249  --  250   < > = values in this interval not displayed.   Basic Metabolic Panel: Recent Labs  Lab 12/31/20 1320 01/01/21 0642 01/02/21 0448 01/03/21 0458  NA 138 140 139 142  K 3.8 3.6 3.4* 4.3  CL 107 108 108 108  CO2 22 25 25 30   GLUCOSE 154* 166* 141* 159*  BUN 15 11 9 8   CREATININE 0.51 0.72 0.57 0.69  CALCIUM 9.2 8.9 8.8* 9.3   GFR: Estimated Creatinine Clearance: 83.9 mL/min (by C-G formula based on SCr of 0.69 mg/dL). Liver Function Tests: Recent Labs  Lab 12/31/20 1320 01/01/21 0642  AST 23 21  ALT 33 26  ALKPHOS 75 55  BILITOT 0.4 0.7  PROT 7.2 6.2*  ALBUMIN 3.9 3.5   Recent Labs  Lab 12/31/20 1320  LIPASE 32   No results for input(s): AMMONIA in the last 168 hours. Coagulation Profile: No results for input(s): INR, PROTIME in the last 168 hours. Cardiac Enzymes: No results for input(s): CKTOTAL, CKMB, CKMBINDEX,  TROPONINI in the last 168 hours. BNP (last 3 results) No results for input(s): PROBNP in the last 8760 hours. HbA1C: Recent Labs    01/01/21 0043  HGBA1C 7.3*   CBG: Recent Labs  Lab 01/02/21 1140 01/02/21 1640 01/02/21 2057 01/03/21 0738 01/03/21 1212  GLUCAP 240* 148* 220* 177* 187*   Lipid Profile: No results for input(s): CHOL, HDL, LDLCALC, TRIG, CHOLHDL, LDLDIRECT in the last 72 hours. Thyroid Function Tests: No results for input(s): TSH, T4TOTAL, FREET4, T3FREE, THYROIDAB in the last 72 hours. Anemia Panel: No results for input(s): VITAMINB12, FOLATE, FERRITIN, TIBC, IRON, RETICCTPCT in the last 72 hours.    Radiology Studies: I have reviewed all of the imaging during this hospital visit personally     Scheduled  Meds: . ciprofloxacin  500 mg Oral BID  . citalopram  40 mg Oral Daily  . insulin aspart  0-15 Units Subcutaneous TID WC  . levothyroxine  100 mcg Oral Q0600  . loratadine  10 mg Oral Daily  . metoprolol succinate  25 mg Oral Daily  . metroNIDAZOLE  500 mg Oral Q12H  . pantoprazole  40 mg Oral Daily  . rosuvastatin  10 mg Oral Daily   Continuous Infusions: . sodium chloride 75 mL/hr at 01/02/21 0435     LOS: 2 days        Printice Hellmer Gerome Apley, MD

## 2021-01-04 DIAGNOSIS — D62 Acute posthemorrhagic anemia: Secondary | ICD-10-CM | POA: Diagnosis not present

## 2021-01-04 DIAGNOSIS — K5792 Diverticulitis of intestine, part unspecified, without perforation or abscess without bleeding: Secondary | ICD-10-CM | POA: Diagnosis not present

## 2021-01-04 DIAGNOSIS — K219 Gastro-esophageal reflux disease without esophagitis: Secondary | ICD-10-CM | POA: Diagnosis not present

## 2021-01-04 DIAGNOSIS — K5790 Diverticulosis of intestine, part unspecified, without perforation or abscess without bleeding: Secondary | ICD-10-CM | POA: Diagnosis not present

## 2021-01-04 LAB — GLUCOSE, CAPILLARY: Glucose-Capillary: 203 mg/dL — ABNORMAL HIGH (ref 70–99)

## 2021-01-04 MED ORDER — CIPROFLOXACIN HCL 500 MG PO TABS: 500.0000 mg | ORAL_TABLET | Freq: Two times a day (BID) | ORAL | 0 refills | Status: AC

## 2021-01-04 MED ORDER — PANTOPRAZOLE SODIUM 40 MG PO TBEC: 40.0000 mg | DELAYED_RELEASE_TABLET | Freq: Every day | ORAL | 0 refills | Status: AC

## 2021-01-04 MED ORDER — METRONIDAZOLE 500 MG PO TABS: 500.0000 mg | ORAL_TABLET | Freq: Two times a day (BID) | ORAL | 0 refills | Status: AC

## 2021-01-04 NOTE — Discharge Summary (Signed)
Physician Discharge Summary  Kristen Conner JYN:[id number] DOB: [birth year] DOA: 12/31/2020  PCP: Ginger Organ., MD  Admit date: 12/31/2020 Discharge date: 01/04/2021  Admitted From: Home  Disposition:  Home   Recommendations for Outpatient Follow-up and new medication changes:  1. Follow up with Dr. Brigitte Pulse in 7 to 10 days.  2. Continue antibiotic therapy with metronidazole and ciprofloxacin for 5 more days.   Home Health: no  Equipment/Devices: no    Discharge Condition: stable  CODE STATUS: full  Diet recommendation:  Soft and advance as tolerated.   Brief/Interim Summary: [name] to the hospital with a working diagnosis of acute diverticulitis, complicated with acute blood loss anemia due to lower GI bleed.   55 year old female with past medical history for diverticulosis, recent colonoscopy about 2 years ago, who presented with worsening hematochezia. Bleeding has been preceded by 3 days of colicky lower abdominal pain, no fevers, no chills, no nausea, vomiting or diarrhea. On her initial physical examination, temperature 98.8, blood pressure 141/84, heart rate 85, respiratory rate 16, oxygen saturation 99% on room air. Her lungs were clear to auscultation, heart S1-S2, present, rhythmic, soft abdomen, nontender to superficial palpation, no lower extremity edema.  Sodium 138, potassium 3.8, chloride 107, bicarb 22, glucose 154, BUN 15, creatinine 0.51, AST 23, ALT 33, white count 7.9, hemoglobin 14.4, hematocrit 41.1, platelets 322. SARS COVID-19 negative.  CT of the abdomen and pelvis with subtle changes of acute diverticulitis in the region of the splenic flexure of the colon. No other diverticular abscess or signs of frankperforation. Cholelithiasis without acute cholecystitis. Aortic atherosclerosis  Admitted to the telemetry ward, placed on IV antibiotic therapy and as needed analgesics. Initially she had continuous hematochezia and slowly  trending down Hgb and Hct.  Cell count has been stable and no further hematochezia. She did not require PRBC transfusion.  Plan to continue oral antibiotic therapy for 5 more days and have outpatient colonoscopy.   1.  Acute diverticulitis with lower GI bleed, acute blood loss anemia.  Patient was admitted to the medical ward, she was placed on a remote telemetry monitor.  Received intravenous fluids and intravenous antibiotic therapy with Zosyn.  Initially she had persistent hematochezia and slowly dropping hemoglobin.  Eventually bleeding stopped, she did not require any PRBC transfusion. Now she has been transitioned to oral antibiotic therapy, metronidazole and ciprofloxacin, she will follow-up with gastroenterology as an outpatient for colonoscopy. Her discharge hemoglobin 10.7, hematocrit 32.3.  2.  Hypertension.  Her antihypertensive agents were held to prevent hypotension.  She tolerated well intravenous fluids.  3.  Type 2 diabetes mellitus.  Dyslipidemia.  Initially patient was placed on a clear liquid diet, her glucose was well controlled with insulin sliding scale. Now patient on a soft diet, she will resume her oral hypoglycemic agents, empagliflozin.  Continue on rosuvastatin.  4.  Hypothyroidism.  Continue levothyroxine.  5.  Depression.  Continue citalopram and alprazolam.   Discharge Diagnoses:  Principal Problem:   Diverticulitis Active Problems:   GI bleed   Diverticulosis   Hypothyroid   T2DM (type 2 diabetes mellitus) ([place])   Acute blood loss anemia   GI bleeding   Gastroesophageal reflux disease    Discharge Instructions   Allergies as of 01/04/2021      Reactions   Anesthesia S-i-40 [propofol] Other (See Comments)   unknown   Demerol    Ketek [telithromycin]    Metformin And Related Other (See Comments)   intolerance   Phenylephrine-apap-guaifenesin Other (  See Comments)   unknown      Medication List    TAKE these medications    acetaminophen 500 MG tablet Commonly known as: TYLENOL Take 1,000 mg by mouth every 6 (six) hours as needed for moderate pain.   ALPRAZolam 0.5 MG tablet Commonly known as: XANAX Take 0.5 mg by mouth daily as needed for anxiety.   ciprofloxacin 500 MG tablet Commonly known as: CIPRO Take 1 tablet (500 mg total) by mouth 2 (two) times daily for 5 days.   citalopram 40 MG tablet Commonly known as: CELEXA Take 40 mg by mouth daily.   ergocalciferol 1.25 MG (50000 UT) capsule Commonly known as: VITAMIN D2 Take 50,000 Units by mouth once a week. Sundays   fexofenadine 180 MG tablet Commonly known as: ALLEGRA Take 180 mg by mouth daily as needed.   Jardiance 25 MG Tabs tablet Generic drug: empagliflozin Take 25 mg by mouth daily.   levothyroxine 100 MCG tablet Commonly known as: SYNTHROID Take 100 mcg by mouth daily.   losartan 50 MG tablet Commonly known as: COZAAR Take 50 mg by mouth daily.   metoprolol succinate 25 MG 24 hr tablet Commonly known as: TOPROL-XL Take 25 mg by mouth daily.   metroNIDAZOLE 500 MG tablet Commonly known as: FLAGYL Take 1 tablet (500 mg total) by mouth every 12 (twelve) hours for 5 days.   Ozempic (0.25 or 0.5 MG/DOSE) 2 MG/1.5ML Sopn Generic drug: Semaglutide(0.25 or 0.5MG /DOS) Inject 0.5 mg into the skin once a week.   pantoprazole 40 MG tablet Commonly known as: PROTONIX Take 1 tablet (40 mg total) by mouth daily.   rosuvastatin 10 MG tablet Commonly known as: CRESTOR Take 10 mg by mouth daily.       Follow-up Information    Ginger Organ., MD Follow up in 1 week(s).   Specialty: Internal Medicine Contact information: [place] [address] [phone]              Allergies  Allergen Reactions  . Anesthesia S-I-40 [Propofol] Other (See Comments)    unknown  . Demerol   . Ketek [Telithromycin]   . Metformin And Related Other (See Comments)    intolerance  . Phenylephrine-Apap-Guaifenesin  Other (See Comments)    unknown    Consultations:  GI    Procedures/Studies: CT ABDOMEN PELVIS WO CONTRAST  Result Date: 12/31/2020 CLINICAL DATA:  55 year old female with history of blood in stool today. Suspected diverticulitis. EXAM: CT ABDOMEN AND PELVIS WITHOUT CONTRAST TECHNIQUE: Multidetector CT imaging of the abdomen and pelvis was performed following the standard protocol without IV contrast. COMPARISON:  CT the abdomen and pelvis 12/30/2011. FINDINGS: Lower chest: Unremarkable. Hepatobiliary: No definite suspicious cystic or solid hepatic lesions are confidently identified on today's noncontrast CT examination. Tiny calcified gallstone lying dependently in the gallbladder. No findings to suggest an acute cholecystitis at this time. Pancreas: No definite pancreatic mass or peripancreatic fluid collections or inflammatory changes are noted on today's noncontrast CT examination. Spleen: Unremarkable. Adrenals/Urinary Tract: There are no abnormal calcifications within the collecting system of either kidney, along the course of either ureter, or within the lumen of the urinary bladder. Exophytic 1.7 cm low-attenuation lesion in the upper pole of the left kidney, incompletely characterized on today's non-contrast CT examination, but favored to represent a small cyst. Right kidney and bilateral adrenal glands are otherwise normal in appearance. Urinary bladder is normal in appearance. Bilateral adrenal glands are normal in appearance. Stomach/Bowel: Unenhanced appearance of  the stomach is normal. No pathologic dilatation of small bowel or colon. Numerous colonic diverticulae are noted. In the region of the splenic flexure (axial image 19 of series 2) there are subtle inflammatory changes adjacent to a diverticulum. No discrete diverticular abscess or frank perforation. Normal appendix. Vascular/Lymphatic: Aortic atherosclerosis. No lymphadenopathy noted in the abdomen or pelvis. Reproductive: Uterus  and ovaries are are unremarkable in appearance. Other: No significant volume of ascites noted in the visualized portions of the peritoneal cavity. Musculoskeletal: There are no aggressive appearing lytic or blastic lesions noted in the visualized portions of the skeleton. IMPRESSION: 1. Subtle changes of acute diverticulitis in the region of the splenic flexure of the colon. No diverticular abscess or signs of frank perforation are noted at this time. 2. Cholelithiasis without evidence of acute cholecystitis. 3. Aortic atherosclerosis. 4. Additional incidental findings, as above. Electronically Signed   By: Vinnie Langton M.D.   On: 12/31/2020 13:52       Subjective: Patient is feeling better, no nausea or vomiting, no chest pain or dyspnea, no further hematochezia or melena.   Discharge Exam: Vitals:   01/04/21 0457 01/04/21 0927  BP: 117/74 128/77  Pulse: 95 89  Resp: 18   Temp: 98.2 F (36.8 C)   SpO2: 95%    Vitals:   01/03/21 1351 01/03/21 2059 01/04/21 0457 01/04/21 0927  BP: 122/66 133/79 117/74 128/77  Pulse: 89 92 95 89  Resp: 18 18 18    Temp: 98.2 F (36.8 C) 97.9 F (36.6 C) 98.2 F (36.8 C)   TempSrc: Oral Oral Oral   SpO2: 99% 99% 95%   Weight:      Height:        General: Not in pain or dyspnea.  Neurology: Awake and alert, non focal  E ENT: mild pallor, no icterus, oral mucosa moist Cardiovascular: No JVD. S1-S2 present, rhythmic, no gallops, rubs, or murmurs. No lower extremity edema. Pulmonary: positive breath sounds bilaterally, adequate air movement, no wheezing, rhonchi or rales. Gastrointestinal. Abdomen soft and non tender Skin. No rashes Musculoskeletal: no joint deformities   The results of significant diagnostics from this hospitalization (including imaging, microbiology, ancillary and laboratory) are listed below for reference.     Microbiology: Recent Results (from the past 240 hour(s))  Resp Panel by RT-PCR (Flu A&B, Covid) Nasopharyngeal  Swab     Status: None   Collection Time: 12/31/20  2:36 PM   Specimen: Nasopharyngeal Swab; Nasopharyngeal(NP) swabs in vial transport medium  Result Value Ref Range Status   SARS Coronavirus 2 by RT PCR NEGATIVE NEGATIVE Final    Comment: (NOTE) SARS-CoV-2 target nucleic acids are NOT DETECTED.  The SARS-CoV-2 RNA is generally detectable in upper respiratory specimens during the acute phase of infection. The lowest concentration of SARS-CoV-2 viral copies this assay can detect is 138 copies/mL. A negative result does not preclude SARS-Cov-2 infection and should not be used as the sole basis for treatment or other patient management decisions. A negative result may occur with  improper specimen collection/handling, submission of specimen other than nasopharyngeal swab, presence of viral mutation(s) within the areas targeted by this assay, and inadequate number of viral copies(<138 copies/mL). A negative result must be combined with clinical observations, patient history, and epidemiological information. The expected result is Negative.  Fact Sheet for Patients:  EntrepreneurPulse.com.au  Fact Sheet for Healthcare Providers:  IncredibleEmployment.be  This test is no t yet approved or cleared by the [place] and  has been authorized  for detection and/or diagnosis of SARS-CoV-2 by FDA under an Emergency Use Authorization (EUA). This EUA will remain  in effect (meaning this test can be used) for the duration of the COVID-19 declaration under Section 564(b)(1) of the Act, 21 U.S.C.section 360bbb-3(b)(1), unless the authorization is terminated  or revoked sooner.       Influenza A by PCR NEGATIVE NEGATIVE Final   Influenza B by PCR NEGATIVE NEGATIVE Final    Comment: (NOTE) The Xpert Xpress SARS-CoV-2/FLU/RSV plus assay is intended as an aid in the diagnosis of influenza from Nasopharyngeal swab specimens and should not be used as a sole  basis for treatment. Nasal washings and aspirates are unacceptable for Xpert Xpress SARS-CoV-2/FLU/RSV testing.  Fact Sheet for Patients: EntrepreneurPulse.com.au  Fact Sheet for Healthcare Providers: IncredibleEmployment.be  This test is not yet approved or cleared by the [place] FDA and has been authorized for detection and/or diagnosis of SARS-CoV-2 by FDA under an Emergency Use Authorization (EUA). This EUA will remain in effect (meaning this test can be used) for the duration of the COVID-19 declaration under Section 564(b)(1) of the Act, 21 U.S.C. section 360bbb-3(b)(1), unless the authorization is terminated or revoked.  Performed at [facility], [address] [place], [place] [address]      Labs: BNP (last 3 results) No results for input(s): BNP in the last 8760 hours. Basic Metabolic Panel: Recent Labs  Lab 12/31/20 1320 01/01/21 0642 01/02/21 0448 01/03/21 0458  NA 138 140 139 142  K 3.8 3.6 3.4* 4.3  CL 107 108 108 108  CO2 22 25 25 30   GLUCOSE 154* 166* 141* 159*  BUN 15 11 9 8   CREATININE 0.51 0.72 0.57 0.69  CALCIUM 9.2 8.9 8.8* 9.3   Liver Function Tests: Recent Labs  Lab 12/31/20 1320 01/01/21 0642  AST 23 21  ALT 33 26  ALKPHOS 75 55  BILITOT 0.4 0.7  PROT 7.2 6.2*  ALBUMIN 3.9 3.5   Recent Labs  Lab 12/31/20 1320  LIPASE 32   No results for input(s): AMMONIA in the last 168 hours. CBC: Recent Labs  Lab 12/31/20 1320 12/31/20 1715 01/01/21 1350 01/01/21 1930 01/02/21 0448 01/02/21 1348 01/03/21 0458  WBC 7.9  --   --   --  6.5  --  7.4  NEUTROABS 4.7  --   --   --   --   --  4.3  HGB 14.4   < > 11.5* 11.8* 10.8* 10.8* 10.7*  HCT 41.1  --  34.3* 35.7* 32.9* 34.0* 32.3*  MCV 88.4  --   --   --  91.9  --  91.5  PLT 322  --   --   --  249  --  250   < > = values in this interval not displayed.   Cardiac Enzymes: No results for input(s): CKTOTAL, CKMB, CKMBINDEX, TROPONINI  in the last 168 hours. BNP: Invalid input(s): POCBNP CBG: Recent Labs  Lab 01/03/21 0738 01/03/21 1212 01/03/21 1641 01/03/21 2100 01/04/21 0843  GLUCAP 177* 187* 189* 212* 203*   D-Dimer No results for input(s): DDIMER in the last 72 hours. Hgb A1c No results for input(s): HGBA1C in the last 72 hours. Lipid Profile No results for input(s): CHOL, HDL, LDLCALC, TRIG, CHOLHDL, LDLDIRECT in the last 72 hours. Thyroid function studies No results for input(s): TSH, T4TOTAL, T3FREE, THYROIDAB in the last 72 hours.  Invalid input(s): FREET3 Anemia work up No results for input(s): VITAMINB12, FOLATE, FERRITIN,  TIBC, IRON, RETICCTPCT in the last 72 hours. Urinalysis    Component Value Date/Time   COLORURINE YELLOW 12/30/2011 1026   APPEARANCEUR CLOUDY (A) 12/30/2011 1026   LABSPEC 1.022 12/30/2011 1026   PHURINE 5.5 12/30/2011 1026   GLUCOSEU NEGATIVE 12/30/2011 1026   HGBUR NEGATIVE 12/30/2011 1026   BILIRUBINUR NEGATIVE 12/30/2011 1026   KETONESUR NEGATIVE 12/30/2011 1026   PROTEINUR NEGATIVE 12/30/2011 1026   UROBILINOGEN 0.2 12/30/2011 1026   NITRITE NEGATIVE 12/30/2011 1026   LEUKOCYTESUR NEGATIVE 12/30/2011 1026   Sepsis Labs Invalid input(s): PROCALCITONIN,  WBC,  LACTICIDVEN Microbiology Recent Results (from the past 240 hour(s))  Resp Panel by RT-PCR (Flu A&B, Covid) Nasopharyngeal Swab     Status: None   Collection Time: 12/31/20  2:36 PM   Specimen: Nasopharyngeal Swab; Nasopharyngeal(NP) swabs in vial transport medium  Result Value Ref Range Status   SARS Coronavirus 2 by RT PCR NEGATIVE NEGATIVE Final    Comment: (NOTE) SARS-CoV-2 target nucleic acids are NOT DETECTED.  The SARS-CoV-2 RNA is generally detectable in upper respiratory specimens during the acute phase of infection. The lowest concentration of SARS-CoV-2 viral copies this assay can detect is 138 copies/mL. A negative result does not preclude SARS-Cov-2 infection and should not be used as the  sole basis for treatment or other patient management decisions. A negative result may occur with  improper specimen collection/handling, submission of specimen other than nasopharyngeal swab, presence of viral mutation(s) within the areas targeted by this assay, and inadequate number of viral copies(<138 copies/mL). A negative result must be combined with clinical observations, patient history, and epidemiological information. The expected result is Negative.  Fact Sheet for Patients:  EntrepreneurPulse.com.au  Fact Sheet for Healthcare Providers:  IncredibleEmployment.be  This test is no t yet approved or cleared by the [place] FDA and  has been authorized for detection and/or diagnosis of SARS-CoV-2 by FDA under an Emergency Use Authorization (EUA). This EUA will remain  in effect (meaning this test can be used) for the duration of the COVID-19 declaration under Section 564(b)(1) of the Act, 21 U.S.C.section 360bbb-3(b)(1), unless the authorization is terminated  or revoked sooner.       Influenza A by PCR NEGATIVE NEGATIVE Final   Influenza B by PCR NEGATIVE NEGATIVE Final    Comment: (NOTE) The Xpert Xpress SARS-CoV-2/FLU/RSV plus assay is intended as an aid in the diagnosis of influenza from Nasopharyngeal swab specimens and should not be used as a sole basis for treatment. Nasal washings and aspirates are unacceptable for Xpert Xpress SARS-CoV-2/FLU/RSV testing.  Fact Sheet for Patients: EntrepreneurPulse.com.au  Fact Sheet for Healthcare Providers: IncredibleEmployment.be  This test is not yet approved or cleared by the [place] FDA and has been authorized for detection and/or diagnosis of SARS-CoV-2 by FDA under an Emergency Use Authorization (EUA). This EUA will remain in effect (meaning this test can be used) for the duration of the COVID-19 declaration under Section 564(b)(1) of the  Act, 21 U.S.C. section 360bbb-3(b)(1), unless the authorization is terminated or revoked.  Performed at [facility], [address] [place], [place] [address]      Time coordinating discharge: 45 minutes  SIGNED:   Tawni Millers, MD  [facility] 01/04/2021, 9:28 AM

## 2021-01-04 NOTE — Progress Notes (Signed)
Went over discharge instructions w/ pt. Pt verbalized understanding.  

## 2021-01-05 LAB — GLUCOSE, CAPILLARY: Glucose-Capillary: 216 mg/dL — ABNORMAL HIGH (ref 70–99)

## 2021-01-31 ENCOUNTER — Ambulatory Visit: Admitting: Nurse Practitioner

## 2021-02-06 ENCOUNTER — Ambulatory Visit: Admitting: Nurse Practitioner

## 2021-02-07 ENCOUNTER — Encounter: Payer: Self-pay | Admitting: Gastroenterology

## 2021-02-07 ENCOUNTER — Ambulatory Visit (INDEPENDENT_AMBULATORY_CARE_PROVIDER_SITE_OTHER): Admitting: Gastroenterology

## 2021-02-07 VITALS — BP 116/64 | HR 80 | Ht 64.0 in | Wt 193.4 lb

## 2021-02-07 DIAGNOSIS — K5792 Diverticulitis of intestine, part unspecified, without perforation or abscess without bleeding: Secondary | ICD-10-CM | POA: Diagnosis not present

## 2021-02-07 DIAGNOSIS — K219 Gastro-esophageal reflux disease without esophagitis: Secondary | ICD-10-CM

## 2021-02-07 DIAGNOSIS — K921 Melena: Secondary | ICD-10-CM

## 2021-02-07 MED ORDER — PEG-KCL-NACL-NASULF-NA ASC-C 100 G PO SOLR: 1.0000 | Freq: Once | ORAL | 0 refills | Status: AC

## 2021-02-07 NOTE — Progress Notes (Signed)
02/07/2021 Kristen Conner [id number] 07-Dec-1965   HISTORY OF PRESENT ILLNESS: This is a 55 year old female who is a patient of Dr. Vena Rua known to him for previous colonoscopy in 2018.  She is here today for hospital follow-up.  Back at the end of May she was hospitalized for diverticular bleed, was passing large clots.  CT scan of the abdomen and pelvis without contrast showed the following:  IMPRESSION: 1. Subtle changes of acute diverticulitis in the region of the splenic flexure of the colon. No diverticular abscess or signs of frank perforation are noted at this time. 2. Cholelithiasis without evidence of acute cholecystitis. 3. Aortic atherosclerosis. 4. Additional incidental findings, as above.   She was seen by our service at that time.  Her symptoms resolved with antibiotics and symptomatic management.  The plan was for colonoscopy about 8 weeks out from her hospital stay.  Hemoglobin had drifted from normal at about 13.5 g down to 10.7 g during her hospitalization.  While she was there she was also started on pantoprazole 40 mg daily for GERD related symptoms.  She apparently has longstanding acid reflux that had been really bad.  She says that since starting the pantoprazole that has been much better.  She has not had any further sign of bleeding.  No abdominal pain.  She tells me that her stools are just chronically loose.  She often times has to go within a short period of time after eating.  Repeat hemoglobin by her PCP on 6/9 showed hemoglobin of 12.9 g.    Colonoscopy 05/2017: 6 and 10 mm sessile polyps removed..  Pandiverticulosis.  Nonbleeding, small internal hemorrhoids. Path: 2 of 5 fragments were hyperplastic, 1 of 5 fragments or leiomyoma, 3 of 5 fragments were benign colonic mucosa.     Past Medical History:  Diagnosis Date   Allergy    Cataract    rt. eye   Depression    Diabetes mellitus without complication ([place])    Diverticulitis    Fibroid tumor     GERD (gastroesophageal reflux disease)    Heart murmur    Hypertension    Hypothyroid    Kidney stone    Ovarian cyst    Reflux    RUQ abdominal pain    Past Surgical History:  Procedure Laterality Date   BREAST BIOPSY Right 10/01/2019   BREAST EXCISIONAL BIOPSY Left 2002   COLONOSCOPY     DILATION AND CURETTAGE OF UTERUS     ENDOMETRIAL ABLATION     EXPLORATORY LAPAROTOMY     LIPOSUCTION     TONSILLECTOMY     TUBAL LIGATION      reports that she has never smoked. She has never used smokeless tobacco. She reports current alcohol use. She reports that she does not use drugs. family history includes Colon polyps in her mother; Lung cancer in her father. Allergies  Allergen Reactions   Demerol    Ketek [Telithromycin]    Metformin And Related Other (See Comments)    intolerance      Outpatient Encounter Medications as of 02/07/2021  Medication Sig   acetaminophen (TYLENOL) 500 MG tablet Take 1,000 mg by mouth every 6 (six) hours as needed for moderate pain.   ALPRAZolam (XANAX) 0.5 MG tablet Take 0.5 mg by mouth daily as needed for anxiety.   citalopram (CELEXA) 40 MG tablet Take 40 mg by mouth daily.   ergocalciferol (VITAMIN D2) 50000 UNITS capsule Take 50,000 Units by mouth once a  week. Sundays   fexofenadine (ALLEGRA) 180 MG tablet Take 180 mg by mouth daily as needed.   JARDIANCE 25 MG TABS tablet Take 25 mg by mouth daily.   levothyroxine (SYNTHROID, LEVOTHROID) 100 MCG tablet Take 100 mcg by mouth daily.   losartan (COZAAR) 50 MG tablet Take 50 mg by mouth daily.   metoprolol succinate (TOPROL-XL) 25 MG 24 hr tablet Take 25 mg by mouth daily.   pantoprazole (PROTONIX) 40 MG tablet Take 1 tablet (40 mg total) by mouth daily.   rosuvastatin (CRESTOR) 10 MG tablet Take 10 mg by mouth daily. (Patient not taking: Reported on 02/07/2021)   Semaglutide,0.25 or 0.5MG /DOS, (OZEMPIC, 0.25 OR 0.5 MG/DOSE,) 2 MG/1.5ML SOPN Inject 0.5 mg into the skin once a week. (Patient not taking:  Reported on 02/07/2021)   No facility-administered encounter medications on file as of 02/07/2021.    REVIEW OF SYSTEMS  : All other systems reviewed and negative except where noted in the History of Present Illness.   PHYSICAL EXAM: BP 116/64 (BP Location: Left Arm, Patient Position: Sitting, Cuff Size: Normal)   Pulse 80   Ht 5\' 4"  (1.626 m) Comment: height measured without shoes  Wt 193 lb 6 oz (87.7 kg)   BMI 33.19 kg/m  General: Well developed white female in no acute distress Head: Normocephalic and atraumatic Eyes:  Sclerae anicteric, conjunctiva pink. Ears: Normal auditory acuity Lungs: Clear throughout to auscultation; no W/R/R. Heart: Regular rate and rhythm; no M/R/G. Abdomen: Soft, non-distended.  BS present. Non-tender. Rectal:  Will be done at the time of colonoscopy. Musculoskeletal: Symmetrical with no gross deformities  Skin: No lesions on visible extremities Extremities: No edema  Neurological: Alert oriented x 4, grossly non-focal Psychological:  Alert and cooperative. Normal mood and affect  ASSESSMENT AND PLAN: *Large-volume hematochezia, suspected to be diverticular in origin, but CT scan also confirming diverticulitis at the splenic flexure.  Patient's symptoms resolved with antibiotics and symptomatic management.  Plan was to repeat colonoscopy as an outpatient about 8 weeks out from her hospital stay.  Last was in 2018 with pandiverticulosis and hyperplastic polyp/leiomyoma.  Hgb in early June by PCP was 12.9 grams.  Plan for colonoscopy with Dr. Hilarie Fredrickson. *GERD: Chronic, longstanding.  Now much better on pantoprazole 40 mg daily.  We will plan for EGD to rule out Barrett's, etc.  **The risks, benefits, and alternatives to colonoscopy were discussed with the patient and she consents to proceed.   CC:  Ginger Organ., MD

## 2021-02-07 NOTE — Patient Instructions (Signed)
You have been scheduled for an endoscopy and colonoscopy. Please follow the written instructions given to you at your visit today. Please pick up your prep supplies at the pharmacy within the next 1-3 days. If you use inhalers (even only as needed), please bring them with you on the day of your procedure.  If you are age 56 or older, your body mass index should be between 23-30. Your Body mass index is 33.19 kg/m. If this is out of the aforementioned range listed, please consider follow up with your Primary Care Provider.  If you are age 36 or younger, your body mass index should be between 19-25. Your Body mass index is 33.19 kg/m. If this is out of the aformentioned range listed, please consider follow up with your Primary Care Provider.   __________________________________________________________  The [facility] providers would like to encourage you to use [facility] to communicate with providers for non-urgent requests or questions.  Due to long hold times on the telephone, sending your provider a message by [facility] may be a faster and more efficient way to get a response.  Please allow 48 business hours for a response.  Please remember that this is for non-urgent requests.

## 2021-03-02 NOTE — Progress Notes (Signed)
Addendum: Reviewed and agree with assessment and management plan. Mckynlie Vanderslice M, MD  

## 2021-03-19 ENCOUNTER — Ambulatory Visit (AMBULATORY_SURGERY_CENTER): Admitting: Internal Medicine

## 2021-03-19 ENCOUNTER — Encounter: Payer: Self-pay | Admitting: Internal Medicine

## 2021-03-19 ENCOUNTER — Other Ambulatory Visit: Payer: Self-pay

## 2021-03-19 VITALS — BP 117/71 | HR 76 | Temp 97.4°F | Resp 24 | Ht 64.0 in | Wt 193.0 lb

## 2021-03-19 DIAGNOSIS — K319 Disease of stomach and duodenum, unspecified: Secondary | ICD-10-CM | POA: Diagnosis not present

## 2021-03-19 DIAGNOSIS — K449 Diaphragmatic hernia without obstruction or gangrene: Secondary | ICD-10-CM | POA: Diagnosis not present

## 2021-03-19 DIAGNOSIS — K5730 Diverticulosis of large intestine without perforation or abscess without bleeding: Secondary | ICD-10-CM | POA: Diagnosis not present

## 2021-03-19 DIAGNOSIS — K219 Gastro-esophageal reflux disease without esophagitis: Secondary | ICD-10-CM | POA: Diagnosis not present

## 2021-03-19 DIAGNOSIS — K5792 Diverticulitis of intestine, part unspecified, without perforation or abscess without bleeding: Secondary | ICD-10-CM

## 2021-03-19 DIAGNOSIS — Z8719 Personal history of other diseases of the digestive system: Secondary | ICD-10-CM | POA: Diagnosis not present

## 2021-03-19 DIAGNOSIS — K2970 Gastritis, unspecified, without bleeding: Secondary | ICD-10-CM | POA: Diagnosis not present

## 2021-03-19 DIAGNOSIS — K5520 Angiodysplasia of colon without hemorrhage: Secondary | ICD-10-CM | POA: Diagnosis not present

## 2021-03-19 DIAGNOSIS — K921 Melena: Secondary | ICD-10-CM

## 2021-03-19 MED ORDER — SODIUM CHLORIDE 0.9 % IV SOLN
500.0000 mL | Freq: Once | INTRAVENOUS | Status: DC
Start: 2021-03-19 — End: 2021-03-19

## 2021-03-19 NOTE — Patient Instructions (Addendum)
Handouts were given to your care partner on  Diverticulosis, GERD, Hiatal Hernia and Gastritis. Your sugar was 172 in the recovery room. You may resume your current medications today. Await biopsy results.  May take 1-3 weeks to receive pathology results. Repeat colonoscopy in 10 years for screening purposes. Please call if any questions or concerns.      YOU HAD AN ENDOSCOPIC PROCEDURE TODAY AT [facility]:   Refer to the procedure report that was given to you for any specific questions about what was found during the examination.  If the procedure report does not answer your questions, please call your gastroenterologist to clarify.  If you requested that your care partner not be given the details of your procedure findings, then the procedure report has been included in a sealed envelope for you to review at your convenience later.  YOU SHOULD EXPECT: Some feelings of bloating in the abdomen. Passage of more gas than usual.  Walking can help get rid of the air that was put into your GI tract during the procedure and reduce the bloating. If you had a lower endoscopy (such as a colonoscopy or flexible sigmoidoscopy) you may notice spotting of blood in your stool or on the toilet paper. If you underwent a bowel prep for your procedure, you may not have a normal bowel movement for a few days.  Please Note:  You might notice some irritation and congestion in your nose or some drainage.  This is from the oxygen used during your procedure.  There is no need for concern and it should clear up in a day or so.  SYMPTOMS TO REPORT IMMEDIATELY:  Following lower endoscopy (colonoscopy or flexible sigmoidoscopy):  Excessive amounts of blood in the stool  Significant tenderness or worsening of abdominal pains  Swelling of the abdomen that is new, acute  Fever of 100F or higher  Following upper endoscopy (EGD)  Vomiting of blood or coffee ground material  New chest pain or pain under  the shoulder blades  Painful or persistently difficult swallowing  New shortness of breath  Fever of 100F or higher  Black, tarry-looking stools  For urgent or emergent issues, a gastroenterologist can be reached at any hour by calling [phone]. Do not use MyChart messaging for urgent concerns.    DIET:  We do recommend a small meal at first, but then you may proceed to your regular diet.  Drink plenty of fluids but you should avoid alcoholic beverages for 24 hours.  ACTIVITY:  You should plan to take it easy for the rest of today and you should NOT DRIVE or use heavy machinery until tomorrow (because of the sedation medicines used during the test).    FOLLOW UP: Our staff will call the number listed on your records 48-72 hours following your procedure to check on you and address any questions or concerns that you may have regarding the information given to you following your procedure. If we do not reach you, we will leave a message.  We will attempt to reach you two times.  During this call, we will ask if you have developed any symptoms of COVID 19. If you develop any symptoms (ie: fever, flu-like symptoms, shortness of breath, cough etc.) before then, please call [phone].  If you test positive for Covid 19 in the 2 weeks post procedure, please call and report this information to [place].    If any biopsies were taken you will be contacted by phone  or by letter within the next 1-3 weeks.  Please call us at [phone] if you have not heard about the biopsies in 3 weeks.    SIGNATURES/CONFIDENTIALITY: You and/or your care partner have signed paperwork which will be entered into your electronic medical record.  These signatures attest to the fact that that the information above on your After Visit Summary has been reviewed and is understood.  Full responsibility of the confidentiality of this discharge information lies with you and/or your care-partner.

## 2021-03-19 NOTE — Progress Notes (Signed)
GASTROENTEROLOGY PROCEDURE H&P NOTE   Primary Care Physician: Ginger Organ., MD    Reason for Procedure:  Longstanding GERD and recent episode of hematochezia  Plan:    EGD and colonoscopy  Patient is appropriate for endoscopic procedure(s) in the ambulatory ([place]) setting.  The nature of the procedure, as well as the risks, benefits, and alternatives were carefully and thoroughly reviewed with the patient. Ample time for discussion and questions allowed. The patient understood, was satisfied, and agreed to proceed.     HPI: Kristen Conner is a 55 y.o. female who presents for EGD and colonoscopy to evaluate GERD and recent hematochezia.  No new complaints today.  See consult note from 02/07/2021 for details.  Past Medical History:  Diagnosis Date   Allergy    Cataract    rt. eye   Depression    Diabetes mellitus without complication ([place])    Diverticulitis    Fibroid tumor    GERD (gastroesophageal reflux disease)    Heart murmur    Hypertension    Hypothyroid    Kidney stone    Ovarian cyst    Reflux    RUQ abdominal pain     Past Surgical History:  Procedure Laterality Date   BREAST BIOPSY Right 10/01/2019   BREAST EXCISIONAL BIOPSY Left 2002   COLONOSCOPY     DILATION AND CURETTAGE OF UTERUS     ENDOMETRIAL ABLATION     EXPLORATORY LAPAROTOMY     LIPOSUCTION     TONSILLECTOMY     TUBAL LIGATION      Prior to Admission medications   Medication Sig Start Date End Date Taking? Authorizing Provider  citalopram (CELEXA) 40 MG tablet Take 40 mg by mouth daily.   Yes [provider]  fexofenadine (ALLEGRA) 180 MG tablet Take 180 mg by mouth daily as needed. 11/22/11  Yes [provider]  JARDIANCE 25 MG TABS tablet Take 25 mg by mouth daily. 11/10/20  Yes [provider]  levothyroxine (SYNTHROID, LEVOTHROID) 100 MCG tablet Take 100 mcg by mouth daily.   Yes [provider]  losartan (COZAAR) 50 MG tablet Take 50 mg  by mouth daily.   Yes [provider]  metoprolol succinate (TOPROL-XL) 25 MG 24 hr tablet Take 25 mg by mouth daily. 08/15/20  Yes [provider]  pantoprazole (PROTONIX) 40 MG tablet Take 1 tablet (40 mg total) by mouth daily. 01/04/21 03/19/21 Yes Arrien, Jimmy Picket, MD  acetaminophen (TYLENOL) 500 MG tablet Take 1,000 mg by mouth every 6 (six) hours as needed for moderate pain.    [provider]  ALPRAZolam Duanne Moron) 0.5 MG tablet Take 0.5 mg by mouth daily as needed for anxiety. 10/16/18   [provider]  ergocalciferol (VITAMIN D2) 50000 UNITS capsule Take 50,000 Units by mouth once a week. Sundays    [provider]  rosuvastatin (CRESTOR) 10 MG tablet Take 10 mg by mouth daily. Patient not taking: No sig reported 09/09/18   [provider]  Semaglutide,0.25 or 0.'5MG'$ /DOS, (OZEMPIC, 0.25 OR 0.5 MG/DOSE,) 2 MG/1.5ML SOPN Inject 0.5 mg into the skin once a week. 12/13/20   [provider]    Current Outpatient Medications  Medication Sig Dispense Refill   citalopram (CELEXA) 40 MG tablet Take 40 mg by mouth daily.     fexofenadine (ALLEGRA) 180 MG tablet Take 180 mg by mouth daily as needed.     JARDIANCE 25 MG TABS tablet Take 25 mg by mouth  daily.     levothyroxine (SYNTHROID, LEVOTHROID) 100 MCG tablet Take 100 mcg by mouth daily.     losartan (COZAAR) 50 MG tablet Take 50 mg by mouth daily.     metoprolol succinate (TOPROL-XL) 25 MG 24 hr tablet Take 25 mg by mouth daily.     pantoprazole (PROTONIX) 40 MG tablet Take 1 tablet (40 mg total) by mouth daily. 30 tablet 0   acetaminophen (TYLENOL) 500 MG tablet Take 1,000 mg by mouth every 6 (six) hours as needed for moderate pain.     ALPRAZolam (XANAX) 0.5 MG tablet Take 0.5 mg by mouth daily as needed for anxiety.     ergocalciferol (VITAMIN D2) 50000 UNITS capsule Take 50,000 Units by mouth once a week. Sundays     rosuvastatin (CRESTOR) 10 MG tablet Take 10 mg by mouth daily.  (Patient not taking: No sig reported)     Semaglutide,0.25 or 0.'5MG'$ /DOS, (OZEMPIC, 0.25 OR 0.5 MG/DOSE,) 2 MG/1.5ML SOPN Inject 0.5 mg into the skin once a week.     Current Facility-Administered Medications  Medication Dose Route Frequency Provider Last Rate Last Admin   0.9 %  sodium chloride infusion  500 mL Intravenous Once Akirra Lacerda, Lajuan Lines, MD        Allergies as of 03/19/2021 - Review Complete 03/19/2021  Allergen Reaction Noted   Demerol  10/03/2010   Ketek [telithromycin]  05/20/2017   Metformin and related Other (See Comments) 12/31/2020    Family History  Problem Relation Age of Onset   Colon polyps Mother    Lung cancer Father    Colon cancer Neg Hx    Esophageal cancer Neg Hx    Rectal cancer Neg Hx    Stomach cancer Neg Hx     Social History   Socioeconomic History   Marital status: Widowed    Spouse name: Not on file   Number of children: Not on file   Years of education: Not on file   Highest education level: Not on file  Occupational History   Not on file  Tobacco Use   Smoking status: Never   Smokeless tobacco: Never  Vaping Use   Vaping Use: Never used  Substance and Sexual Activity   Alcohol use: Yes    Comment: rarely  once a month   Drug use: No   Sexual activity: Not on file  Other Topics Concern   Not on file  Social History Narrative   Not on file   Social Determinants of Health   Financial Resource Strain: Not on file  Food Insecurity: Not on file  Transportation Needs: Not on file  Physical Activity: Not on file  Stress: Not on file  Social Connections: Not on file  Intimate Partner Violence: Not on file    Physical Exam: Vital signs in last 24 hours: '@BP'$  (!) 151/83   Pulse 84   Temp (!) 97.4 F (36.3 C)   Resp 19   Ht '5\' 4"'$  (1.626 m)   Wt 193 lb (87.5 kg)   SpO2 96%   BMI 33.13 kg/m  GEN: NAD EYE: Sclerae anicteric ENT: MMM CV: Non-tachycardic Pulm: CTA b/l GI: Soft, NT/ND NEURO:  Alert & Oriented x 3   Zenovia Jarred, MD [facility]  03/19/2021 8:11 AM

## 2021-03-19 NOTE — Progress Notes (Signed)
Pt in recovery with monitors in place, VSS. Report given to receiving RN. Bite guard was placed with pt awake to ensure comfort. No dental or soft tissue damage noted. 

## 2021-03-19 NOTE — Progress Notes (Signed)
VS taken by C.W. 

## 2021-03-19 NOTE — Op Note (Signed)
[facility] Patient Name: Kristen Conner Procedure Date: 03/19/2021 7:15 AM MRN: [id number] Endoscopist: Jerene Bears , MD Age: 55 Referring MD:  Date of Birth: November 25, 1965 Gender: Female Account #: [id number] Procedure:                Colonoscopy Indications:              Hematochezia (resolved), last colonoscopy 2018 Medicines:                Monitored Anesthesia Care Procedure:                Pre-Anesthesia Assessment:                           - Prior to the procedure, a History and Physical                            was performed, and patient medications and                            allergies were reviewed. The patient's tolerance of                            previous anesthesia was also reviewed. The risks                            and benefits of the procedure and the sedation                            options and risks were discussed with the patient.                            All questions were answered, and informed consent                            was obtained. Prior Anticoagulants: The patient has                            taken no previous anticoagulant or antiplatelet                            agents. ASA Grade Assessment: II - A patient with                            mild systemic disease. After reviewing the risks                            and benefits, the patient was deemed in                            satisfactory condition to undergo the procedure.                           After obtaining informed consent, the colonoscope  was passed under direct vision. Throughout the                            procedure, the patient's blood pressure, pulse, and                            oxygen saturations were monitored continuously. The                            CF [id number] was introduced through the anus                            and advanced to the cecum, identified by                            appendiceal  orifice and ileocecal valve. The                            colonoscopy was performed without difficulty. The                            patient tolerated the procedure well. The quality                            of the bowel preparation was good. The ileocecal                            valve, appendiceal orifice, and rectum were                            photographed. Scope In: 8:22:07 AM Scope Out: 8:42:43 AM Scope Withdrawal Time: 0 hours 14 minutes 25 seconds  Total Procedure Duration: 0 hours 20 minutes 36 seconds  Findings:                 The digital rectal exam was normal.                           A single small angioectasia without bleeding was                            found in the cecum.                           Multiple small and large-mouthed diverticula were                            found in the descending colon, splenic flexure,                            proximal transverse colon, hepatic flexure and                            ascending colon.  The retroflexed view of the distal rectum and anal                            verge was normal and showed no anal or rectal                            abnormalities. Complications:            No immediate complications. Estimated Blood Loss:     Estimated blood loss: none. Impression:               - A single non-bleeding colonic angioectasia.                           - Diverticulosis in the descending colon, at the                            splenic flexure, in the proximal transverse colon,                            at the hepatic flexure and in the ascending colon.                            This is the likely source of now resolved                            hematochezia.                           - No specimens collected. Recommendation:           - Patient has a contact number available for                            emergencies. The signs and symptoms of potential                             delayed complications were discussed with the                            patient. Return to normal activities tomorrow.                            Written discharge instructions were provided to the                            patient.                           - Resume previous diet.                           - Continue present medications.                           - Repeat colonoscopy in 10 years for screening  purposes. Jerene Bears, MD 03/19/2021 8:48:39 AM This report has been signed electronically.

## 2021-03-19 NOTE — Op Note (Signed)
[address] Patient Name: Kristen Conner Procedure Date: 03/19/2021 7:15 AM MRN: [id number] Endoscopist: Jerene Bears , MD Age: 55 Referring MD:  Date of Birth: July 17, 1966 Gender: Female Account #: [id number] Procedure:                Upper GI endoscopy Indications:              Gastro-esophageal reflux disease Medicines:                Monitored Anesthesia Care Procedure:                Pre-Anesthesia Assessment:                           - Prior to the procedure, a History and Physical                            was performed, and patient medications and                            allergies were reviewed. The patient's tolerance of                            previous anesthesia was also reviewed. The risks                            and benefits of the procedure and the sedation                            options and risks were discussed with the patient.                            All questions were answered, and informed consent                            was obtained. Prior Anticoagulants: The patient has                            taken no previous anticoagulant or antiplatelet                            agents. ASA Grade Assessment: II - A patient with                            mild systemic disease. After reviewing the risks                            and benefits, the patient was deemed in                            satisfactory condition to undergo the procedure.                           After obtaining informed consent, the endoscope was  passed under direct vision. Throughout the                            procedure, the patient's blood pressure, pulse, and                            oxygen saturations were monitored continuously. The                            Endoscope was introduced through the mouth, and                            advanced to the second part of duodenum. The upper                            GI endoscopy was  accomplished without difficulty.                            The patient tolerated the procedure well. Scope In: Scope Out: Findings:                 The examined esophagus was normal.                           A 1 cm hiatal hernia was present.                           The gastroesophageal flap valve was visualized                            endoscopically and classified as Hill Grade IV (no                            fold, wide open lumen, hiatal hernia present).                           Patchy mildly erythematous mucosa without bleeding                            was found in the gastric antrum. Biopsies were                            taken with a cold forceps for histology and                            Helicobacter pylori testing.                           The examined duodenum was normal. Complications:            No immediate complications. Estimated Blood Loss:     Estimated blood loss was minimal. Impression:               - Normal esophagus.                           -  1 cm hiatal hernia.                           - Erythematous mucosa in the antrum. Biopsied.                           - Normal examined duodenum. Recommendation:           - Patient has a contact number available for                            emergencies. The signs and symptoms of potential                            delayed complications were discussed with the                            patient. Return to normal activities tomorrow.                            Written discharge instructions were provided to the                            patient.                           - Resume previous diet.                           - Continue present medications.                           - Await pathology results. Jerene Bears, MD 03/19/2021 8:46:05 AM This report has been signed electronically.

## 2021-03-19 NOTE — Progress Notes (Signed)
No problems noted in the recovery room. maw 

## 2021-03-19 NOTE — Progress Notes (Signed)
Called to room to assist during endoscopic procedure.  Patient ID and intended procedure confirmed with present staff. Received instructions for my participation in the procedure from the performing physician.  

## 2021-03-21 ENCOUNTER — Telehealth: Payer: Self-pay

## 2021-03-21 NOTE — Telephone Encounter (Signed)
  Follow up Call-  Call back number 03/19/2021  Post procedure Call Back phone  # [phone]  Permission to leave phone message Yes  Some recent data might be hidden     Patient questions:  Do you have a fever, pain , or abdominal swelling? No. Pain Score  0 *  Have you tolerated food without any problems? Yes.    Have you been able to return to your normal activities? Yes.    Do you have any questions about your discharge instructions: Diet   No. Medications  No. Follow up visit  No.  Do you have questions or concerns about your Care? No.  Actions: * If pain score is 4 or above: No action needed, pain <4.

## 2021-03-22 ENCOUNTER — Encounter: Payer: Self-pay | Admitting: Internal Medicine

## 2022-02-21 ENCOUNTER — Other Ambulatory Visit: Payer: Self-pay | Admitting: Internal Medicine

## 2022-02-21 DIAGNOSIS — E785 Hyperlipidemia, unspecified: Secondary | ICD-10-CM

## 2022-02-25 ENCOUNTER — Other Ambulatory Visit: Payer: Self-pay | Admitting: Internal Medicine

## 2022-02-25 DIAGNOSIS — R1011 Right upper quadrant pain: Secondary | ICD-10-CM

## 2022-03-04 ENCOUNTER — Other Ambulatory Visit

## 2022-03-06 ENCOUNTER — Ambulatory Visit
Admission: RE | Admit: 2022-03-06 | Discharge: 2022-03-06 | Disposition: A | Discharge status: Home / Self Care | Source: Ambulatory Visit | Attending: Internal Medicine | Admitting: Internal Medicine

## 2022-03-06 DIAGNOSIS — R1011 Right upper quadrant pain: Secondary | ICD-10-CM

## 2022-05-08 ENCOUNTER — Other Ambulatory Visit: Payer: Self-pay | Admitting: Internal Medicine

## 2022-05-08 ENCOUNTER — Ambulatory Visit
Admission: RE | Admit: 2022-05-08 | Discharge: 2022-05-08 | Disposition: A | Discharge status: Home / Self Care | Payer: No Typology Code available for payment source | Source: Ambulatory Visit | Attending: Internal Medicine | Admitting: Internal Medicine

## 2022-05-08 DIAGNOSIS — E785 Hyperlipidemia, unspecified: Secondary | ICD-10-CM

## 2022-05-13 ENCOUNTER — Other Ambulatory Visit: Payer: Self-pay | Admitting: General Surgery

## 2022-05-23 ENCOUNTER — Ambulatory Visit
Admission: RE | Admit: 2022-05-23 | Discharge: 2022-05-23 | Disposition: A | Discharge status: Home / Self Care | Payer: No Typology Code available for payment source | Source: Ambulatory Visit | Attending: Internal Medicine | Admitting: Internal Medicine

## 2022-05-23 DIAGNOSIS — E785 Hyperlipidemia, unspecified: Secondary | ICD-10-CM

## 2022-11-02 IMAGING — MG MM DIGITAL SCREENING BILAT W/ TOMO AND CAD
8 series · 8 of 24 positions shown · non-contrast
Comparison: Previous exam(s).

CLINICAL DATA: Screening.

EXAM:
DIGITAL SCREENING BILATERAL MAMMOGRAM WITH TOMOSYNTHESIS AND CAD
TECHNIQUE: Bilateral screening digital craniocaudal and mediolateral oblique
mammograms were obtained. Bilateral screening digital breast
tomosynthesis was performed. The images were evaluated with
computer-aided detection.

[R CC synth-2D]
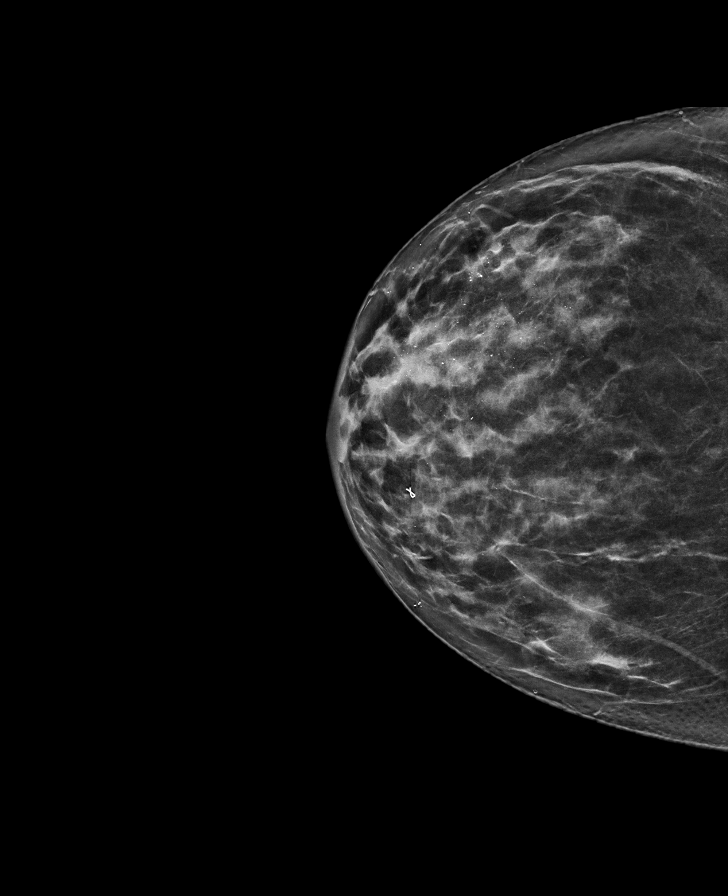

[L MLO synth-2D]
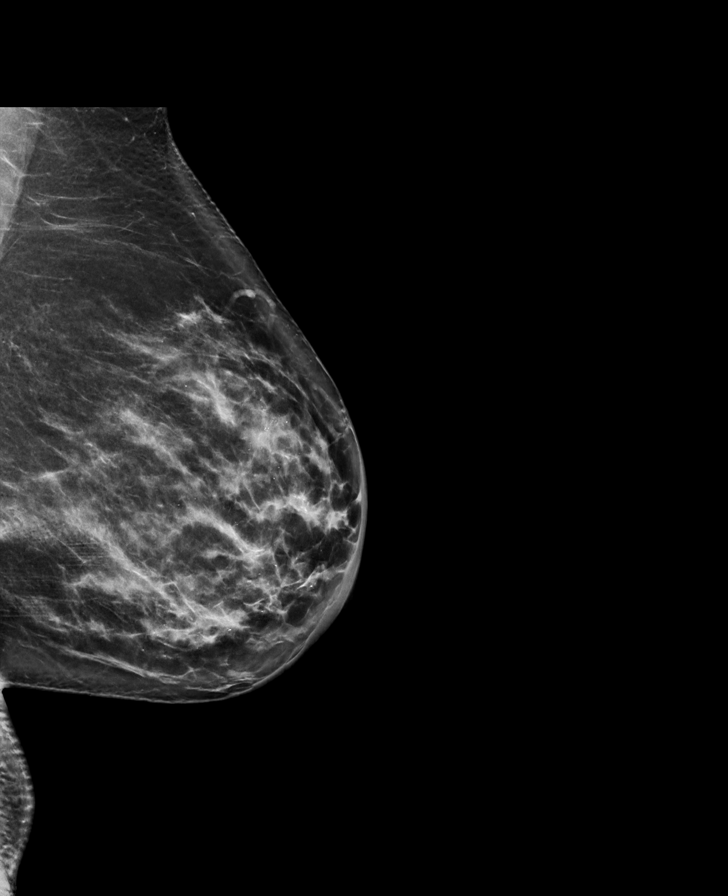

[R MLO synth-2D]
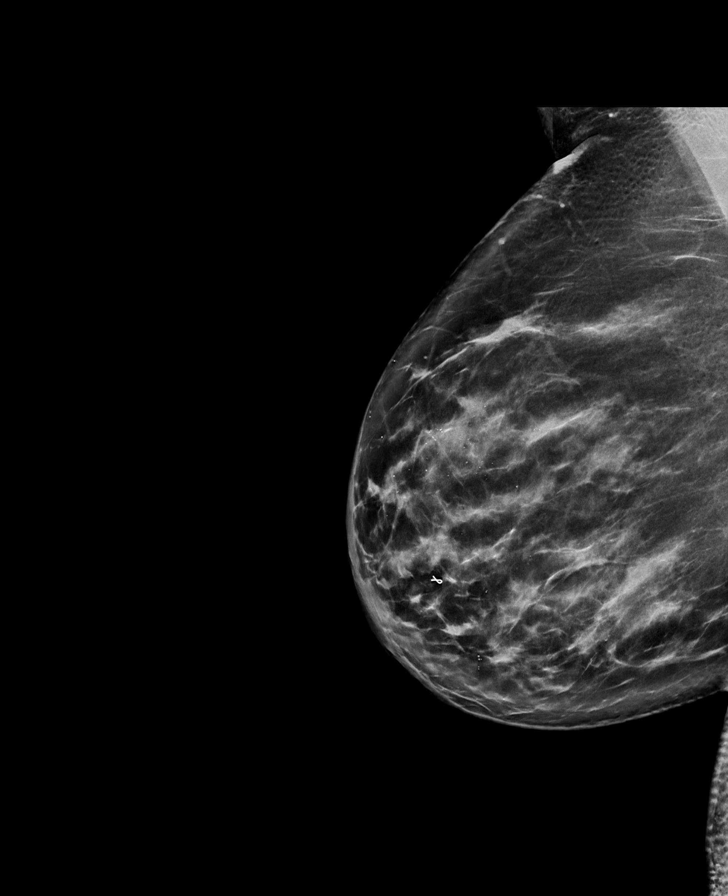

[L CC synth-2D]
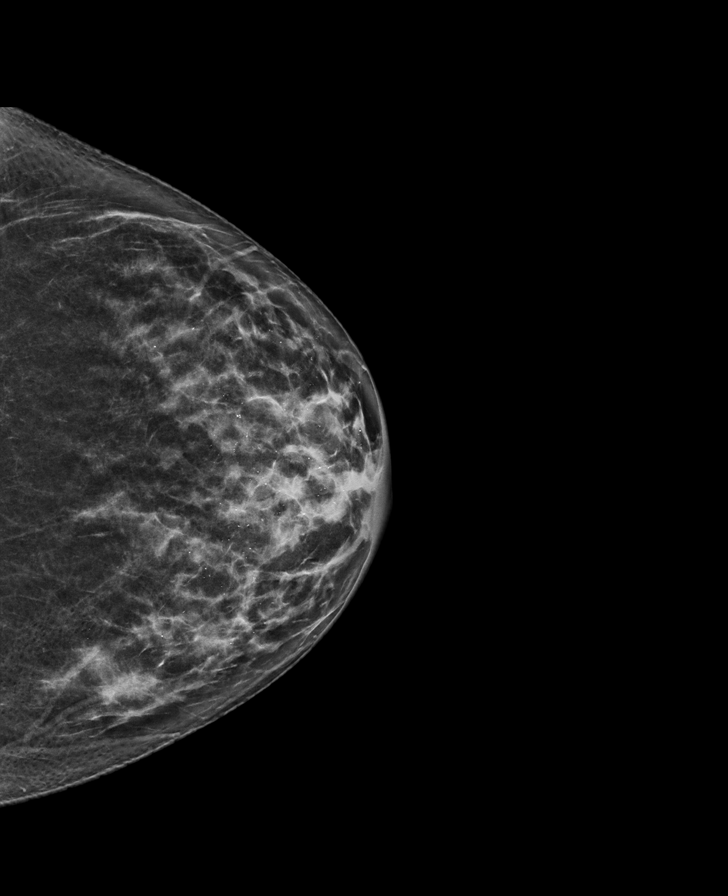

[L CC tomo · tomo slice 37/72.0]
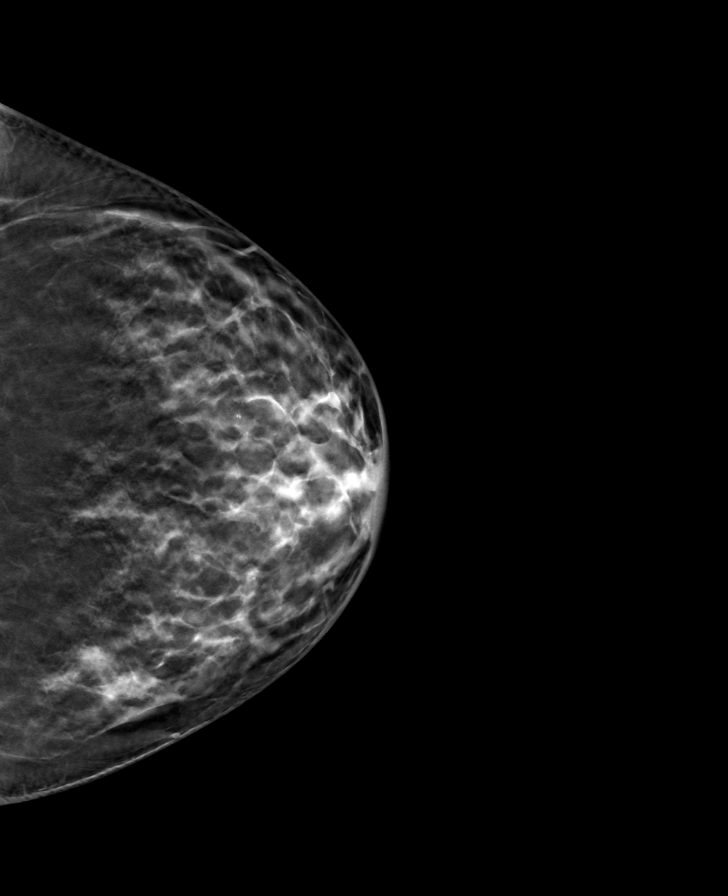

[R MLO tomo · tomo slice 49/98.0]
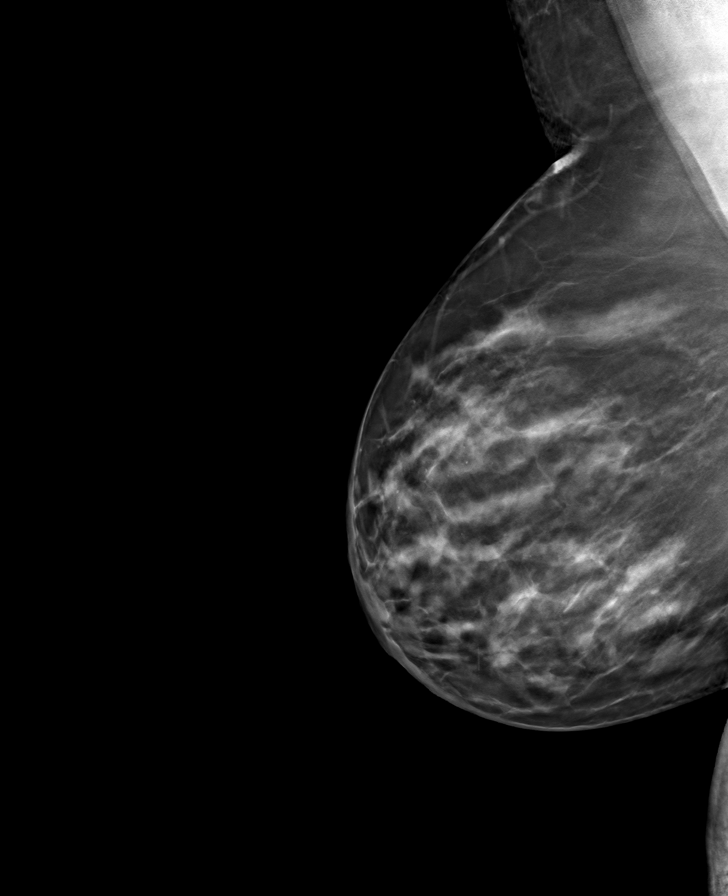

[R CC tomo · tomo slice 39/76.0]
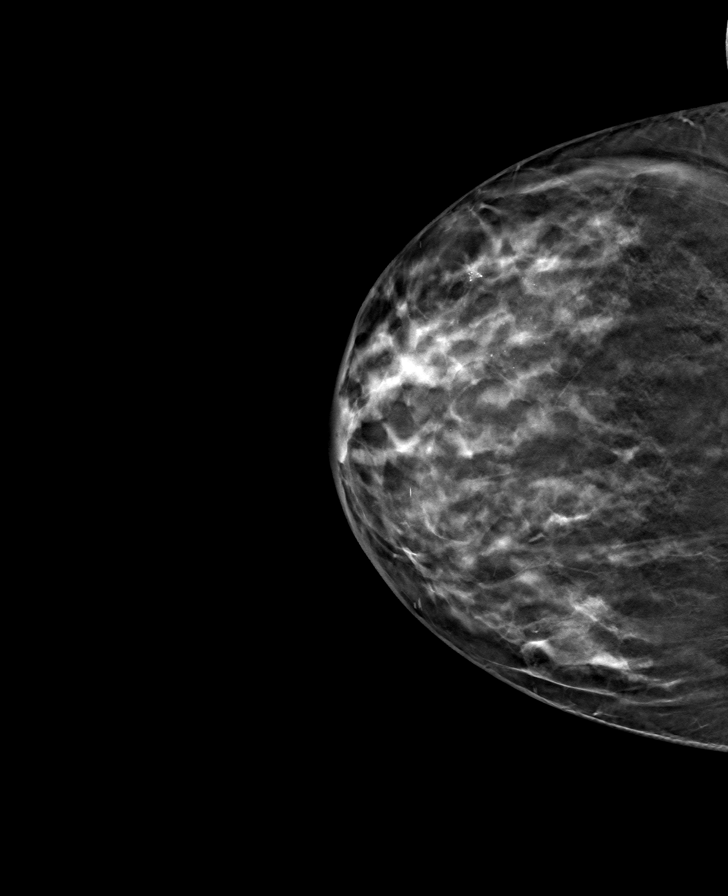

[L MLO tomo · tomo slice 45/90.0]
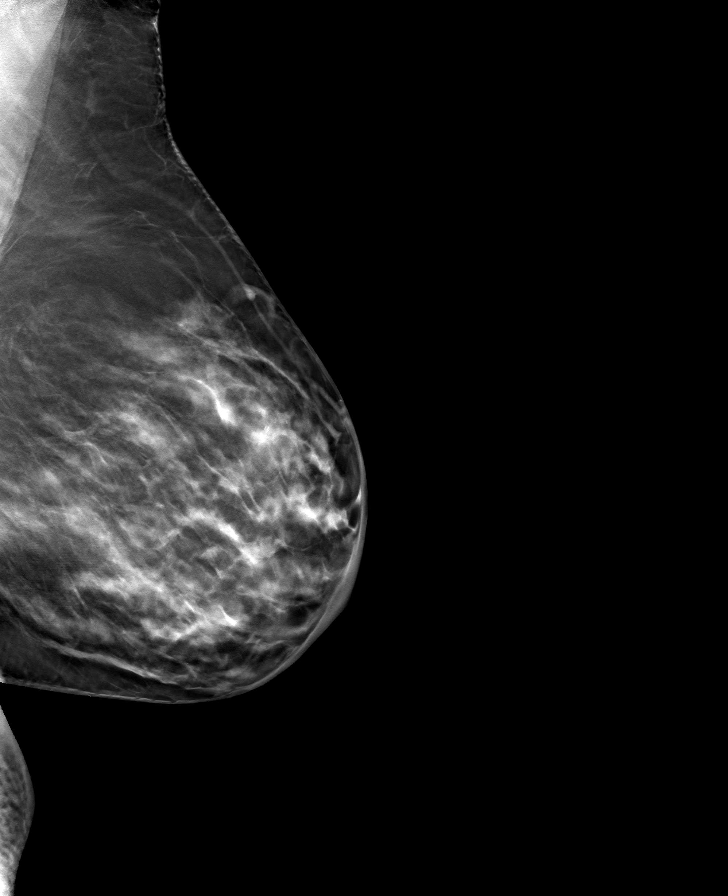

[8 of 24 positions shown; findings below may reference images not displayed]

ACR Breast Density Category c: The breast tissue is heterogeneously
dense, which may obscure small masses.
FINDINGS: There are no findings suspicious for malignancy. The images were
evaluated with computer-aided detection.
IMPRESSION: No mammographic evidence of malignancy. A result letter of this
screening mammogram will be mailed directly to the patient.

RECOMMENDATION:
Screening mammogram in one year. (Code:T4-5-GWO)

BI-RADS CATEGORY  1: Negative.

## 2023-01-01 IMAGING — CT CT ABD-PELV W/O CM
2 of 4 series · 16 of 46 positions shown, 18 images · non-contrast
Comparison: CT the abdomen and pelvis 12/30/2011.

CLINICAL DATA: 55-year-old female with history of blood in stool
today. Suspected diverticulitis.

EXAM:
CT ABDOMEN AND PELVIS WITHOUT CONTRAST
TECHNIQUE: Multidetector CT imaging of the abdomen and pelvis was performed
following the standard protocol without IV contrast.

[Series 2: axial st · axial · 0.98mm/px · z∈[+474,+854]mm · 13 of 90 slices shown, 15 images]
[im 7/90  soft-tissue]
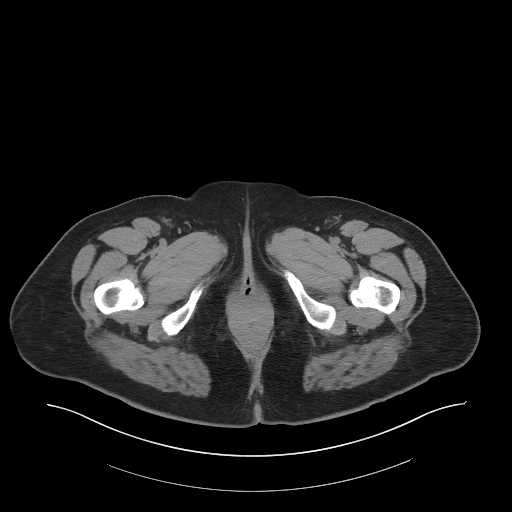
[im 7/90  bone]
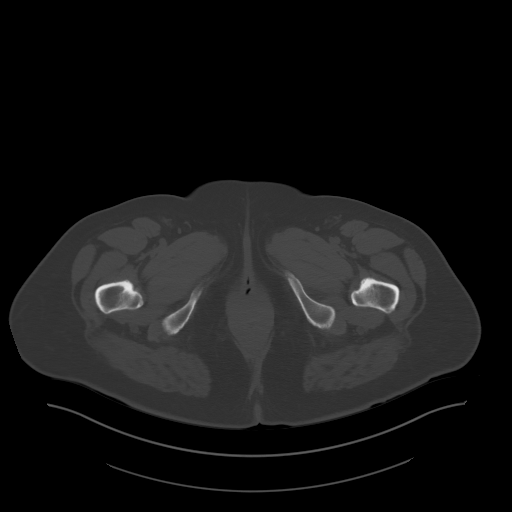
[im 13/90  soft-tissue]
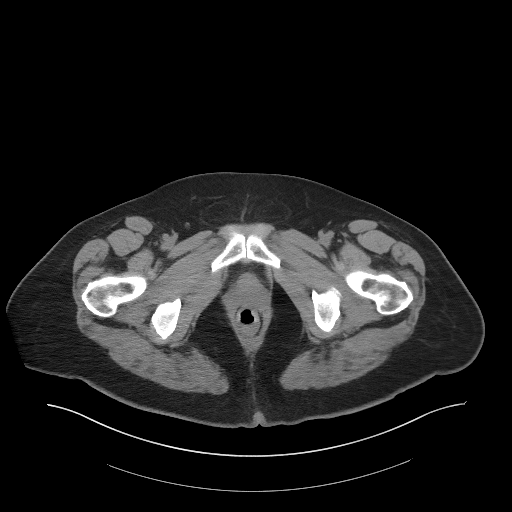
[im 20/90  soft-tissue]
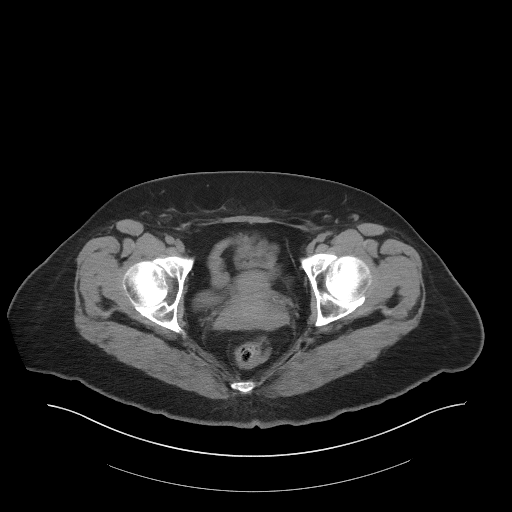
[im 26/90  soft-tissue]
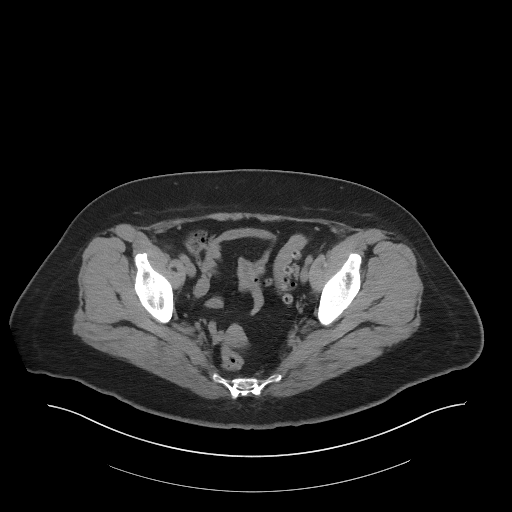
[im 32/90  soft-tissue]
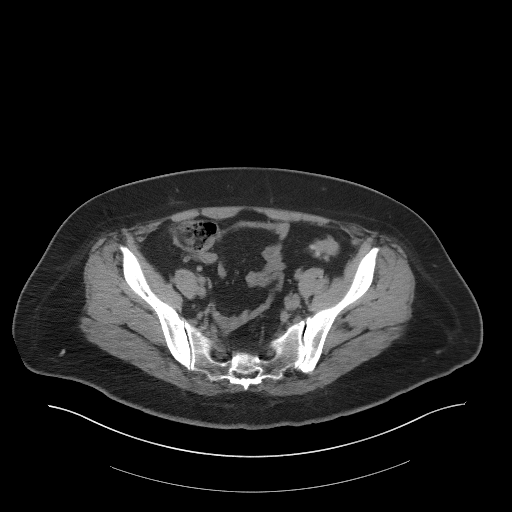
[im 39/90  soft-tissue]
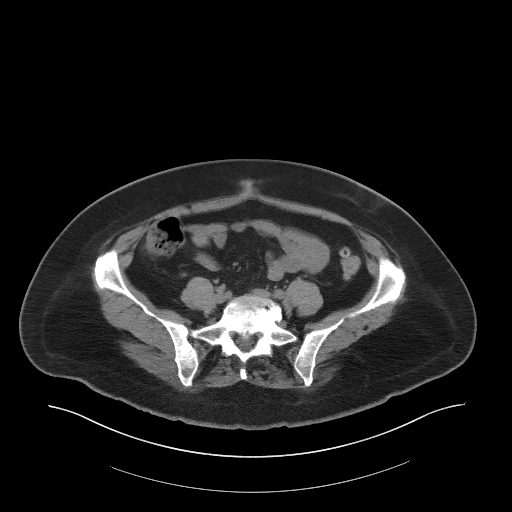
[im 45/90  soft-tissue]
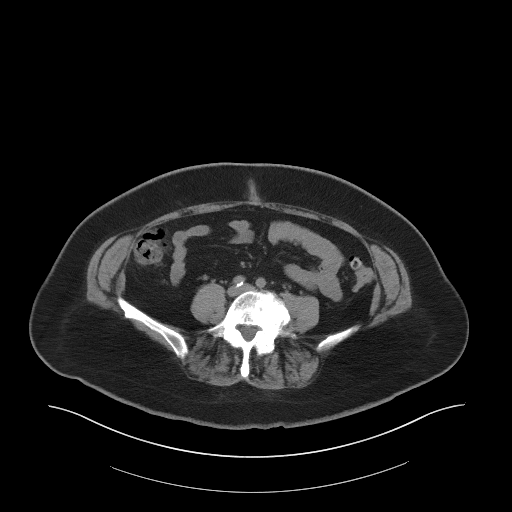
[im 51/90  soft-tissue]
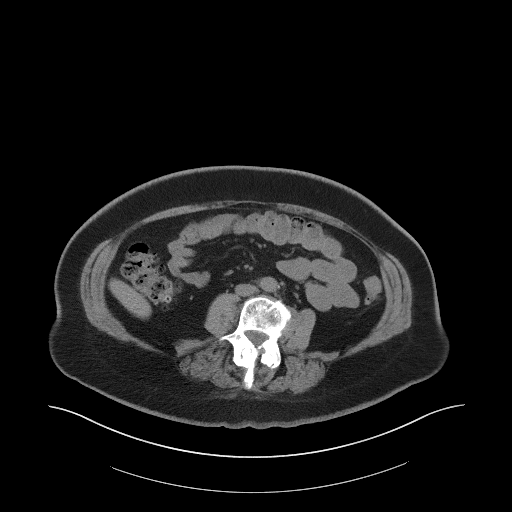
[im 58/90  soft-tissue]
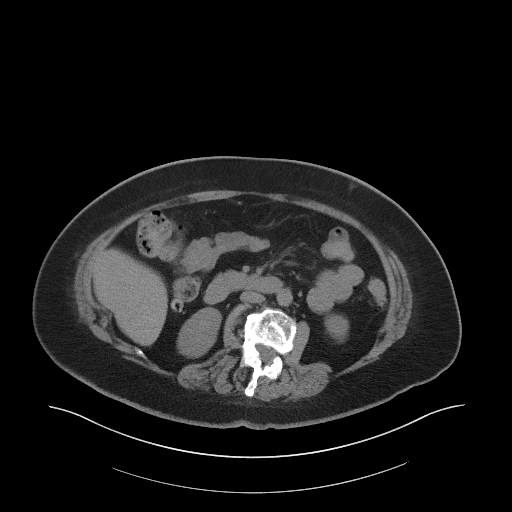
[im 58/90  bone]
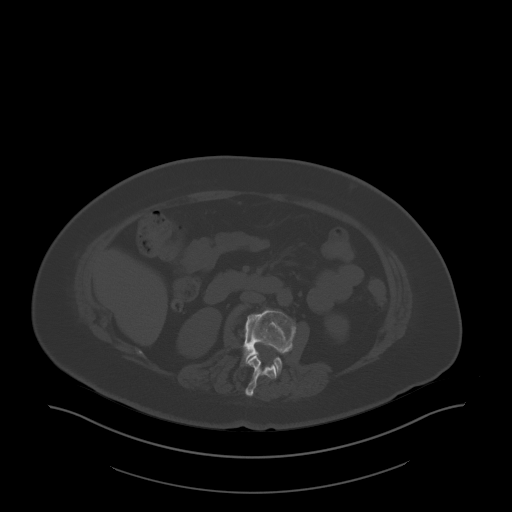
[im 64/90  soft-tissue]
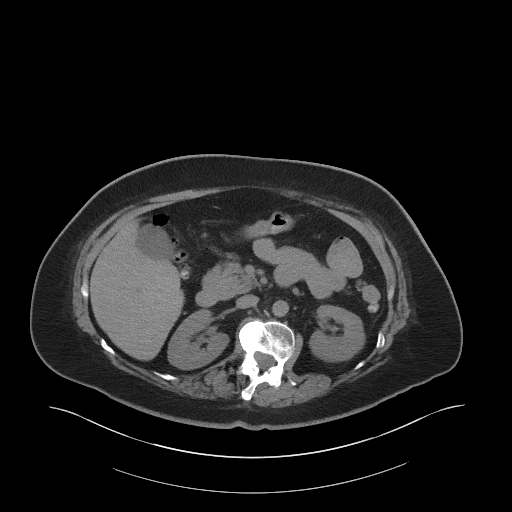
[im 70/90  soft-tissue]
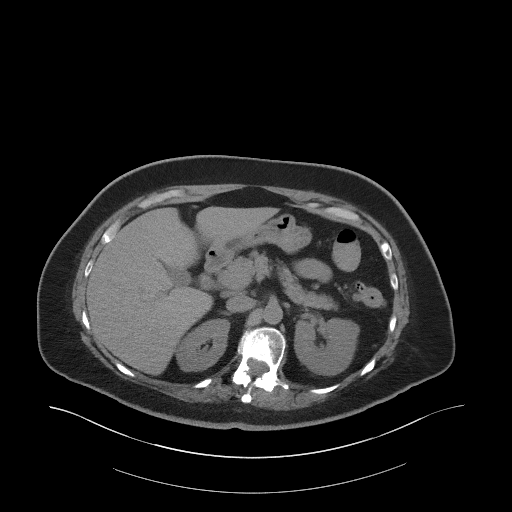
[im 77/90  soft-tissue]
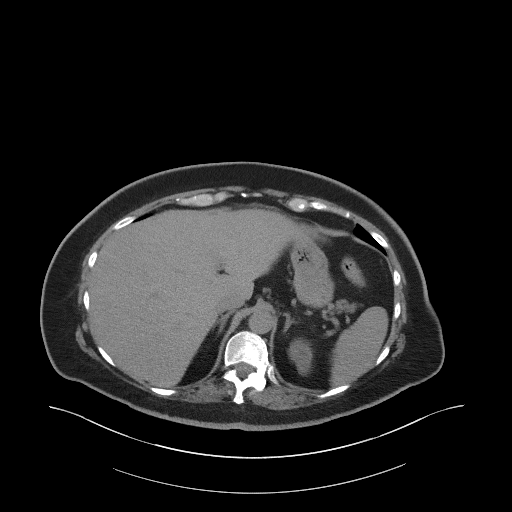
[im 83/90  soft-tissue]
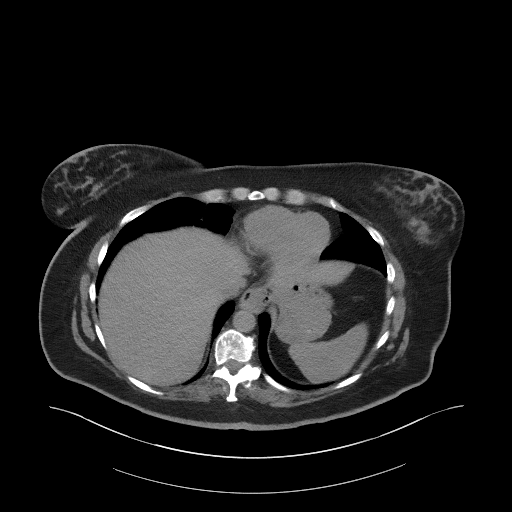

[Series 5: coronal st · coronal · 0.86mm/px · 3 of 92 slices shown]
[im 31/92  soft-tissue]
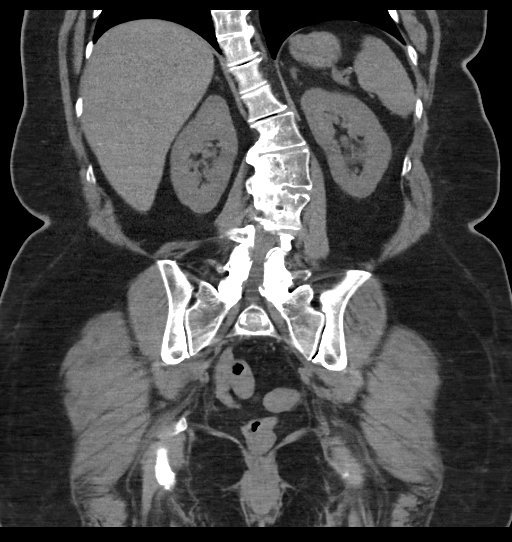
[im 41/92  soft-tissue]
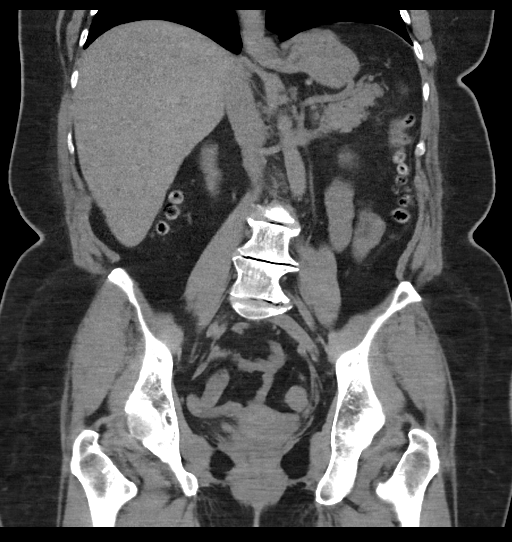
[im 51/92  soft-tissue]
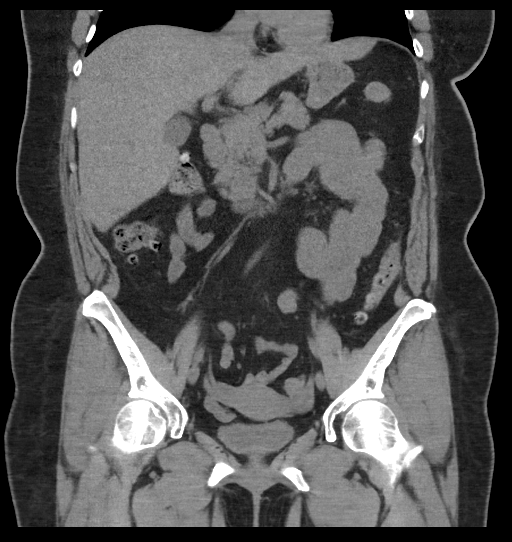

[16 of 46 positions shown; findings below may reference images not displayed]

FINDINGS: Lower chest: Unremarkable.

Hepatobiliary: No definite suspicious cystic or solid hepatic
lesions are confidently identified on today's noncontrast CT
examination. Tiny calcified gallstone lying dependently in the
gallbladder. No findings to suggest an acute cholecystitis at this
time.

Pancreas: No definite pancreatic mass or peripancreatic fluid
collections or inflammatory changes are noted on today's noncontrast
CT examination.

Spleen: Unremarkable.

Adrenals/Urinary Tract: There are no abnormal calcifications within
the collecting system of either kidney, along the course of either
ureter, or within the lumen of the urinary bladder. Exophytic 1.7 cm
low-attenuation lesion in the upper pole of the left kidney,
incompletely characterized on today's non-contrast CT examination,
but favored to represent a small cyst. Right kidney and bilateral
adrenal glands are otherwise normal in appearance. Urinary bladder
is normal in appearance. Bilateral adrenal glands are normal in
appearance.

Stomach/Bowel: Unenhanced appearance of the stomach is normal. No
pathologic dilatation of small bowel or colon. Numerous colonic
diverticulae are noted. In the region of the splenic flexure (axial
image 19 of series 2) there are subtle inflammatory changes adjacent
to a diverticulum. No discrete diverticular abscess or frank
perforation. Normal appendix.

Vascular/Lymphatic: Aortic atherosclerosis. No lymphadenopathy noted
in the abdomen or pelvis.

Reproductive: Uterus and ovaries are are unremarkable in appearance.

Other: No significant volume of ascites noted in the visualized
portions of the peritoneal cavity.

Musculoskeletal: There are no aggressive appearing lytic or blastic
lesions noted in the visualized portions of the skeleton.
IMPRESSION: 1. Subtle changes of acute diverticulitis in the region of the
splenic flexure of the colon. No diverticular abscess or signs of
frank perforation are noted at this time.
2. Cholelithiasis without evidence of acute cholecystitis.
3. Aortic atherosclerosis.
4. Additional incidental findings, as above.

## 2023-04-20 DIAGNOSIS — R002 Palpitations: Secondary | ICD-10-CM

## 2023-04-30 ENCOUNTER — Telehealth: Payer: Self-pay | Admitting: Internal Medicine

## 2023-04-30 NOTE — Telephone Encounter (Signed)
Received a call from [facility] [employer] regarding an 8 beat run of NSVT. Increased threshold for notification to 20 seconds.

## 2023-05-01 ENCOUNTER — Telehealth: Payer: Self-pay

## 2023-05-01 NOTE — Telephone Encounter (Signed)
Left voicemail to return call to office.  On call was notified and he increased threshold to 20 secs

## 2023-05-01 NOTE — Telephone Encounter (Signed)
Cardiac Monitor Alert  Date of alert:  05/01/2023   Patient Name: Kristen Conner  DOB: [birth year]  MRN: [id number]   [facility] Cardiologist: None  [facility] EP:  None    Monitor Information: Cardiac Event Monitor [[employer]]  Reason:   Ordering provider:  Martha Clan   Alert Ventricular Tachycardia This is the 1st alert for this rhythm.   Next Cardiology Appointment   Date:    Provider:    The patient was contacted today.  She is asymptomatic. Arrhythmia, symptoms and history reviewed with .   Plan:  [employer] notified on call and he increased threshold to 20 secs  Other: Patient stated she was asleep at the time of alert last night.  Sabina Beavers Merilynn Finland, LPN  11/11/8117 1:47 PM

## 2023-05-22 ENCOUNTER — Ambulatory Visit: Payer: Self-pay | Attending: Internal Medicine

## 2023-05-22 ENCOUNTER — Other Ambulatory Visit: Payer: Self-pay | Admitting: *Deleted

## 2023-05-22 DIAGNOSIS — R002 Palpitations: Secondary | ICD-10-CM

## 2024-05-18 ENCOUNTER — Ambulatory Visit (INDEPENDENT_AMBULATORY_CARE_PROVIDER_SITE_OTHER): Admitting: Otolaryngology

## 2024-05-18 ENCOUNTER — Ambulatory Visit (INDEPENDENT_AMBULATORY_CARE_PROVIDER_SITE_OTHER): Admitting: Audiology

## 2024-05-18 ENCOUNTER — Encounter (INDEPENDENT_AMBULATORY_CARE_PROVIDER_SITE_OTHER): Payer: Self-pay | Admitting: Otolaryngology

## 2024-05-18 VITALS — BP 108/72 | HR 78 | Ht 64.0 in | Wt 185.0 lb

## 2024-05-18 DIAGNOSIS — J3089 Other allergic rhinitis: Secondary | ICD-10-CM | POA: Diagnosis not present

## 2024-05-18 DIAGNOSIS — H903 Sensorineural hearing loss, bilateral: Secondary | ICD-10-CM | POA: Diagnosis not present

## 2024-05-18 DIAGNOSIS — H938X1 Other specified disorders of right ear: Secondary | ICD-10-CM | POA: Diagnosis not present

## 2024-05-18 DIAGNOSIS — H6991 Unspecified Eustachian tube disorder, right ear: Secondary | ICD-10-CM | POA: Diagnosis not present

## 2024-05-18 MED ORDER — PREDNISONE 20 MG PO TABS: 20.0000 mg | ORAL_TABLET | Freq: Every day | ORAL | 0 refills | Status: AC

## 2024-05-18 MED ORDER — FLONASE SENSIMIST 27.5 MCG/SPRAY NA SUSP: 2.0000 | Freq: Every evening | NASAL | 12 refills | Status: AC

## 2024-05-18 NOTE — Patient Instructions (Signed)
 Pop your ears multiple times per day Use flonase sensimist two sprays each nostril nightly Use prednisone 20mg  every day for 1 week

## 2024-05-18 NOTE — Progress Notes (Signed)
  [address] [place], [place] [address] [phone]  Audiological Evaluation    Name: Kristen Conner     DOB:   [birth year]      MRN:   [id number]                                                                                     Service Date: 05/18/2024     Accompanied by: unaccompanied   Patient comes today after Dr. Tobie, ENT sent a referral for a hearing evaluation due to concerns with hearing loss with onset September 2025 when she was told to have an external ear infection and reportedly was prescribed with antibiotics.   Symptoms Yes Details  Hearing loss  [x]  Right hearing los, some days better than others  Tinnitus  []    Ear pain/ infections/pressure  [x]  Reports some right era popping and crackling, some slight ache  Balance problems  [x]  Sometimes's off balance when bending over  Noise exposure history  []    Previous ear surgeries  []    Family history of hearing loss  []    Amplification  []    Other  []      Otoscopy: Right ear: Clear external ear canal and notable landmarks visualized on the tympanic membrane. Left ear:  Clear external ear canal and notable landmarks visualized on the tympanic membrane.  Tympanometry: Right ear: Type A- Normal external ear canal volume with normal middle ear pressure and tympanic membrane compliance. Left ear: Type A- Normal external ear canal volume with normal middle ear pressure and tympanic membrane compliance.   Pure tone Audiometry: Right ear- Normal hearing from [phone] Hz, then mild presumably sensorineural hearing loss from 6000 Hz - 8000 Hz. Left ear-  Normal hearing from 918 842 1768 Hz, then mild presumably sensorineural hearing loss from 6000 Hz - 8000 Hz.  Speech Audiometry: Right ear- Speech Reception Threshold (SRT) was obtained at 15 dBHL. Left ear-Speech Reception Threshold (SRT) was obtained at 15 dBHL.   Word Recognition Score Tested using NU-6 (recorded) Right ear: 96% was obtained at a  presentation level of 50 dBHL with contralateral masking which is deemed as  excellent. Left ear: 100% was obtained at a presentation level of 50 dBHL with contralateral masking which is deemed as  excellent.   The hearing test results were completed under headphones and results are deemed to be of good reliability. Test technique:  conventional    Impression: There is not a significant difference in pure-tone thresholds between ears.There is not a significant difference in the word recognition score in between ears.    Recommendations: Follow up with ENT as scheduled for today.  Return for a hearing evaluation if concerns with hearing changes arise or per MD recommendation.   Kam Kushnir MARIE LEROUX-MARTINEZ, AUD

## 2024-05-18 NOTE — Progress Notes (Signed)
 Dear Dr. Loreli, Here is my assessment for our mutual patient, Kristen Conner. Thank you for allowing me the opportunity to care for your patient. Please do not hesitate to contact me should you have any other questions. Sincerely, Dr. Eldora Blanch  Otolaryngology Clinic Note Referring provider: Dr. Loreli HPI:  Initial visit (05/2024):  Discussed the use of AI scribe software for clinical note transcription with the patient, who gave verbal consent to proceed.  History of Present Illness Kristen Conner is a 58 year old female with diabetes who presents with ear fullness and muffled hearing  She experiences decreased hearing and a muffled sensation in the right ear, described as feeling 'like you're underwater or in a tunnel.' There is a popping and crackling sound, similar to 'Rice Krispies,' when she blows her nose. Symptoms began towards the end of August after a beach trip and starting to use new noise-canceling headsets at work. Mild hearing loss subjective.  She was initially treated with oral Augmentin  and Cortisporin ear drops, which improved her symptoms, but they have recurred over the past couple of weeks. There is no significant ear pain, only occasional discomfort, and no ear drainage or tinnitus or fran vertigo. She has not undergone ear surgery and does not have frequent ear infections or issues. She does not experience ear fullness when flying or driving through mountains.  Her current medications include Allegra daily for allergies. She avoids nasal sprays due to headaches caused by Flonase. 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 No neck pain which is chronic.   Ear surgery: denies  Patient also denies barotrauma, vestibular suppressant use, ototoxic medication use  Personal or FHx of bleeding dz or anesthesia  difficulty: no  AP/AC: no  Tobacco: no  PMHx: DM, Hypothyroidism, HTN, Allergic Rhinitis, GERD  Independent Review of Additional Tests or Records:  Dr. Loreli Referral notes reviewed and uploaded or available in chart in media tab (04/14/2024): noted continuous ear issues, prescribed antibiotics and ear drops for 10 days, now feels stuffy; Dx: Ear fullness; Rx: OE - cortisporin ref to ENT Labs reviewed and uploaded or available in chart in media tab (04/07/2024): BMP - BUN/Cr 11/0.7; CBC - WBC 5.7 05/2024 Audiogram was independently reviewed and interpreted by me and it reveals - normal downsloping to mild SNHL AU; noted mild asymmetry but <10dB AD worse than AS; WRT 96%/100% AD/Au at 50dB HL; A/A tymps   SNHL= Sensorineural hearing loss   PMH/Meds/All/SocHx/FamHx/ROS:   Past Medical History:  Diagnosis Date   Allergy    Cataract    rt. eye   Depression    Diabetes mellitus without complication (HCC)    Diverticulitis    Fibroid tumor    GERD (gastroesophageal reflux disease)    Heart murmur    Hypertension    Hypothyroid    Kidney stone    Ovarian cyst    Reflux    RUQ abdominal pain      Past Surgical History:  Procedure Laterality Date   BREAST BIOPSY Right 10/01/2019   BREAST EXCISIONAL BIOPSY Left 2002   COLONOSCOPY     DILATION AND CURETTAGE OF UTERUS     ENDOMETRIAL ABLATION     EXPLORATORY LAPAROTOMY     LIPOSUCTION     TONSILLECTOMY     TUBAL LIGATION      Family History  Problem  Relation Age of Onset   Colon polyps Mother    Lung cancer Father    Colon cancer Neg Hx    Esophageal cancer Neg Hx    Rectal cancer Neg Hx    Stomach cancer Neg Hx      Social Connections: Not on file      Current Outpatient Medications:    acetaminophen  (TYLENOL ) 500 MG tablet, Take 1,000 mg by mouth every 6 (six) hours as needed for moderate pain., Disp: , Rfl:    ALPRAZolam  (XANAX ) 0.5 MG tablet, Take 0.5 mg by mouth daily as needed for anxiety., Disp: , Rfl:     citalopram  (CELEXA ) 40 MG tablet, Take 40 mg by mouth daily., Disp: , Rfl:    ergocalciferol (VITAMIN D2) 50000 UNITS capsule, Take 50,000 Units by mouth once a week. Sundays, Disp: , Rfl:    fexofenadine (ALLEGRA) 180 MG tablet, Take 180 mg by mouth daily as needed., Disp: , Rfl:    fluticasone (FLONASE SENSIMIST) 27.5 MCG/SPRAY nasal spray, Place 2 sprays into the nose at bedtime., Disp: 10 g, Rfl: 12   JARDIANCE 25 MG TABS tablet, Take 25 mg by mouth daily., Disp: , Rfl:    levothyroxine  (SYNTHROID , LEVOTHROID) 100 MCG tablet, Take 100 mcg by mouth daily., Disp: , Rfl:    losartan (COZAAR) 50 MG tablet, Take 50 mg by mouth daily., Disp: , Rfl:    metoprolol  succinate (TOPROL -XL) 25 MG 24 hr tablet, Take 25 mg by mouth daily., Disp: , Rfl:    pantoprazole  (PROTONIX ) 40 MG tablet, Take 1 tablet (40 mg total) by mouth daily., Disp: 30 tablet, Rfl: 0   predniSONE (DELTASONE) 20 MG tablet, Take 1 tablet (20 mg total) by mouth daily with breakfast for 7 days., Disp: 7 tablet, Rfl: 0   Semaglutide,0.25 or 0.5MG /DOS, (OZEMPIC, 0.25 OR 0.5 MG/DOSE,) 2 MG/1.5ML SOPN, Inject 0.5 mg into the skin once a week., Disp: , Rfl:    rosuvastatin  (CRESTOR ) 10 MG tablet, Take 10 mg by mouth daily. (Patient not taking: Reported on 05/18/2024), Disp: , Rfl:    Physical Exam:   BP 108/72 (BP Location: Right Arm, Patient Position: Sitting, Cuff Size: Large)   Pulse 78   Ht 5' 4 (1.626 m)   Wt 185 lb (83.9 kg)   SpO2 97%   BMI 31.76 kg/m   Salient findings:  CN II-XII intact Given history and complaints, ear microscopy was indicated and performed for evaluation with findings as below in physical exam section and in procedures; Bilateral EAC clear and TM intact with well pneumatized middle ear spaces Weber 512: mid Rinne 512: AC > BC b/l Anterior rhinoscopy: Septum intact; bilateral inferior turbinates without significant hypertrophy No lesions of oral cavity/oropharynx No obviously palpable neck  masses/lymphadenopathy/thyromegaly No respiratory distress or stridor Trace TMJ crepitus b/l  Seprately Identifiable Procedures:  Prior to initiating any procedures, risks/benefits/alternatives were explained to the patient and verbal consent obtained. Procedure: Bilateral ear microscopy using microscope (CPT [phone]) Pre-procedure diagnosis: right ear fullness and muffled hearing Post-procedure diagnosis: same Indication: see above; given patient's otologic complaints and history, for improved and comprehensive examination of external ear and tympanic membrane, bilateral otologic examination using microscope was performed. Prior to proceeding, verbal consent was obtained after discussion of R/B/A  Procedure: Patient was placed semi-recumbent. Both ear canals were examined using the microscope with findings above. Patient tolerated the procedure well.   Impression & Plans:  Kristen Conner is a 58 y.o. female with:  1. Ear fullness,  right   2. Dysfunction of right eustachian tube   3. Sensorineural hearing loss, bilateral   4. Other allergic rhinitis    Based on sx, appears most likely to be ETD which is intermittent. Could be referred as well given TMJ but less likely. Discussed options including medical mgmt and given A/A tymps would not rec tymp tube. She agrees - Prescribed 20 mg prednisone for 7 days. - Recommended Flonase Sensimist, 2 sprays in each nostril once daily at night for 6-8 weeks. - Instructed to pop ears multiple times a day. - Suggested changing noise-canceling headsets to old ones.  Mild right sensorineural hearing loss Mild sensorineural hearing loss AU, primarily in higher pitches. Will observe  Allergic rhinitis Chronic allergic rhinitis managed with Allegra. Flonase previously caused headaches due to its scent. - Recommend switching from Allegra to Xyzal if current treatment is ineffective. - Prescribed Flonase Sensimist or children's version to avoid  headaches.  - f/u discussed, she opted PRN - will call if does not improve  See below regarding exact medications prescribed this encounter including dosages and route: Meds ordered this encounter  Medications   predniSONE (DELTASONE) 20 MG tablet    Sig: Take 1 tablet (20 mg total) by mouth daily with breakfast for 7 days.    Dispense:  7 tablet    Refill:  0   fluticasone (FLONASE SENSIMIST) 27.5 MCG/SPRAY nasal spray    Sig: Place 2 sprays into the nose at bedtime.    Dispense:  10 g    Refill:  12      Thank you for allowing me the opportunity to care for your patient. Please do not hesitate to contact me should you have any other questions.  Sincerely, Eldora Blanch, MD Otolaryngologist (ENT), [facility] Phone: [phone] Fax: [phone]  05/18/2024, 11:28 AM   MDM:  [phone] Complexity/Problems addressed: mod Data complexity: mod - independent review of note, labs, order/review test - Morbidity: mod  - Prescription Drug prescribed or managed: y

## 2024-05-25 ENCOUNTER — Encounter: Payer: Self-pay | Admitting: Audiology
